# Patient Record
Sex: Female | Born: 1985
Health system: Southern US, Community
[De-identification: ages and names within clinical notes are randomized; demographics above are authoritative.]

## PROBLEM LIST (undated history)

## (undated) DIAGNOSIS — K219 Gastro-esophageal reflux disease without esophagitis: Secondary | ICD-10-CM

## (undated) DIAGNOSIS — D649 Anemia, unspecified: Secondary | ICD-10-CM

## (undated) DIAGNOSIS — R635 Abnormal weight gain: Secondary | ICD-10-CM

## (undated) DIAGNOSIS — Z8619 Personal history of other infectious and parasitic diseases: Secondary | ICD-10-CM

## (undated) DIAGNOSIS — C4491 Basal cell carcinoma of skin, unspecified: Secondary | ICD-10-CM

## (undated) DIAGNOSIS — F32A Depression, unspecified: Secondary | ICD-10-CM

## (undated) DIAGNOSIS — M2550 Pain in unspecified joint: Secondary | ICD-10-CM

## (undated) HISTORY — DX: Personal history of other infectious and parasitic diseases: Z86.19

## (undated) HISTORY — DX: Pain in unspecified joint: M25.50

## (undated) HISTORY — DX: Gastro-esophageal reflux disease without esophagitis: K21.9

## (undated) HISTORY — PX: LUMBAR DISC SURGERY: SHX700

## (undated) HISTORY — DX: Depression, unspecified: F32.A

## (undated) HISTORY — DX: Basal cell carcinoma of skin, unspecified: C44.91

## (undated) HISTORY — PX: WISDOM TOOTH EXTRACTION: SHX21

## (undated) HISTORY — DX: Anemia, unspecified: D64.9

## (undated) HISTORY — DX: Abnormal weight gain: R63.5

---

## 2009-10-22 HISTORY — PX: DILATION AND CURETTAGE OF UTERUS: SHX78

## 2012-01-02 DIAGNOSIS — D229 Melanocytic nevi, unspecified: Secondary | ICD-10-CM | POA: Insufficient documentation

## 2014-09-01 LAB — TSH: TSH: 1.74 u[IU]/mL (ref <=5.90)

## 2014-09-01 LAB — BASIC METABOLIC PANEL
Creatinine: 0.8 mg/dL (ref <=1.1)
Glucose: 93 mg/dL
Potassium: 4.4 mmol/L (ref 3.4–5.3)

## 2014-09-01 LAB — HEPATIC FUNCTION PANEL
ALT: 19 U/L (ref 7–35)
AST: 17 U/L (ref 13–35)
Alkaline Phosphatase: 61 U/L (ref 25–125)

## 2014-09-01 LAB — LIPID PANEL
Cholesterol: 164 mg/dL (ref 0–200)
HDL: 48 mg/dL (ref 35–70)
LDL Cholesterol: 112 mg/dL
Triglycerides: 84 mg/dL (ref 40–160)

## 2015-05-27 ENCOUNTER — Ambulatory Visit (INDEPENDENT_AMBULATORY_CARE_PROVIDER_SITE_OTHER): Payer: 59 | Admitting: Family Medicine

## 2015-05-27 ENCOUNTER — Encounter: Payer: Self-pay | Admitting: Family Medicine

## 2015-05-27 VITALS — BP 121/78 | HR 63 | Ht 65.0 in | Wt 174.0 lb

## 2015-05-27 DIAGNOSIS — E669 Obesity, unspecified: Secondary | ICD-10-CM | POA: Diagnosis not present

## 2015-05-27 DIAGNOSIS — M25511 Pain in right shoulder: Secondary | ICD-10-CM | POA: Diagnosis not present

## 2015-05-27 MED ORDER — PHENTERMINE HCL 37.5 MG PO TABS: 37.5000 mg | ORAL_TABLET | Freq: Every day | ORAL | Status: DC

## 2015-05-27 NOTE — Progress Notes (Signed)
CC: Kimberly Orr is a 29 y.o. 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 She once had an MRI done on it and she is not really sure what the results were. On direct questioning she thinks bursitis sounds familiar. She's never had any injections or physical therapy. It's localized on the lateral aspect of the right shoulder. Nonradiating. It's worse with placing her hand above her head or behind her back. Denies any catching swelling redness or warmth of the joint. He denies any recent or remote trauma. Denies any motor or sensory disturbances in the right upper extremity. There will be weeks or she has absolutely no pain at all where for no reasonable than just flareup for another few weeks and go away on its own. Mild improvement with ibuprofen.  Complains of difficulty with losing weight. She has trouble with portion control and sticking to an exercise routine. She's been successful with eating food gets healthier but portions seem to be a problem. She wants another medication to help her lose weight.  Review of Systems - General ROS: negative for - chills, fever, night sweats, weight gain or weight loss Ophthalmic ROS: negative for - decreased vision Psychological ROS: negative for - anxiety or depression ENT ROS: negative for - hearing change, nasal congestion, tinnitus or allergies Hematological and Lymphatic ROS: negative for - bleeding problems, bruising or swollen lymph nodes Breast ROS: negative Respiratory ROS: no cough, shortness of breath, or wheezing Cardiovascular ROS: no chest pain or dyspnea on exertion Gastrointestinal ROS: no abdominal pain, change in bowel habits, or black or bloody stools Genito-Urinary ROS: negative for - genital discharge, genital ulcers, incontinence or abnormal bleeding from genitals Musculoskeletal ROS:  negative for - joint pain or muscle pain other than that described above Neurological ROS: negative for - headaches or memory loss Dermatological ROS: negative for lumps, mole changes, rash and skin lesion changes  Past Medical History  Diagnosis Date  . History of chicken pox     Past Surgical History  Procedure Laterality Date  . Wisdom tooth extraction    . Dilation and curettage of uterus  2011   Family History  Problem Relation Age of Onset  . Skin cancer      mother and father   . Heart attack Maternal Grandfather   . Hypertension Maternal Grandfather     History   Social History  . Marital Status: Married    Spouse Name: N/A  . Number of Children: N/A  . Years of Education: N/A   Occupational History  . Not on file.   Social History Main Topics  . Smoking status: Never Smoker   . Smokeless tobacco: Not on file  . Alcohol Use: No  . Drug Use: No  . Sexual Activity:    Partners: Male   Other Topics Concern  . Not on file   Social History Narrative  . No narrative on file     Objective: BP 121/78 mmHg  Pulse 63  Ht 5\' 5"  (1.651 m)  Wt 174 lb (78.926 kg)  BMI 28.96 kg/m2  General: Alert and Oriented, No Acute Distress HEENT: Pupils equal, round, reactive to light. Conjunctivae clear.  Moist mucous membranes Lungs: Clear comfortable work of breathing Cardiac: Regular rate and rhythm.  Right shoulder exam reveals full range of motion and strength in all planes  of motion and with individual rotator cuff testing. No overlying redness warmth or swelling.  Neer's test positive.  Hawkins test negative. Empty can negative. Crossarm test negative. O'Brien's test positive. Apprehension test negative. Speed's test negative. Extremities: No peripheral edema.  Strong peripheral pulses.  Mental Status: No depression, anxiety, nor agitation. Skin: Warm and dry.  Assessment & Plan: Kimberly Orr was seen today for establish care.  Diagnoses and all orders for this  visit:  Right shoulder pain  Obesity Orders: -     phentermine (ADIPEX-P) 37.5 MG tablet; Take 1 tablet (37.5 mg total) by mouth daily before breakfast.   Right shoulder pain: Suspect bursitis or tendinitis. The fact that goes away completely for weeks at a time makes me think that a labral tear is low on the differential. Obesity: Starting phentermine, discussed that this is only a temporary fix for losing weight and that exercising more frequently would greatly improve her weight loss.  Return if symptoms worsen or fail to improve.

## 2015-06-23 ENCOUNTER — Ambulatory Visit (INDEPENDENT_AMBULATORY_CARE_PROVIDER_SITE_OTHER): Payer: 59 | Admitting: Family Medicine

## 2015-06-23 VITALS — BP 127/87 | HR 78 | Wt 170.0 lb

## 2015-06-23 DIAGNOSIS — R635 Abnormal weight gain: Secondary | ICD-10-CM | POA: Diagnosis not present

## 2015-06-23 DIAGNOSIS — E669 Obesity, unspecified: Secondary | ICD-10-CM | POA: Diagnosis not present

## 2015-06-23 MED ORDER — PHENTERMINE HCL 37.5 MG PO TABS: 37.5000 mg | ORAL_TABLET | Freq: Every day | ORAL | Status: DC

## 2015-06-23 NOTE — Progress Notes (Signed)
   Subjective:    Patient ID: Kimberly Orr, female    DOB: May 30, 1986, 29 y.o.   MRN: [id number]  HPI Patient is here for blood pressure and weight check. Denies any trouble sleeping, palpitations, or any other medication problems.    Review of Systems     Objective:   Physical Exam        Assessment & Plan:  Patient has lost weight. A refill for Phentermine will be sent to patient preferred pharmacy. Patient advised to schedule a four week nurse visit and keep her upcoming appointment with her PCP. Verbalized understanding, no further questions.  Abnormal weight gain-has lost 4 pounds which is fantastic and blood pressures well controlled. Continue current regimen and follow-up in one month.  Beatrice Lecher, MD

## 2015-08-03 ENCOUNTER — Encounter: Payer: Self-pay | Admitting: Family Medicine

## 2015-08-10 ENCOUNTER — Ambulatory Visit (INDEPENDENT_AMBULATORY_CARE_PROVIDER_SITE_OTHER): Payer: 59 | Admitting: Family Medicine

## 2015-08-10 VITALS — BP 122/81 | HR 99 | Wt 166.0 lb

## 2015-08-10 DIAGNOSIS — E669 Obesity, unspecified: Secondary | ICD-10-CM

## 2015-08-10 MED ORDER — PHENTERMINE HCL 37.5 MG PO TABS: 37.5000 mg | ORAL_TABLET | Freq: Every day | ORAL | Status: DC

## 2015-08-10 NOTE — Progress Notes (Signed)
   Subjective:    Patient ID: Kimberly Orr, female    DOB: Dec 05, 1985, 29 y.o.   MRN: [id number]  HPI  Patient is here for blood pressure and weight check. Denies any trouble sleeping, palpitations, or any other medication problems. Pt reports she is cutting back on her soda and started working out more.  Review of Systems     Objective:   Physical Exam        Assessment & Plan:  Patient has lost weight. A refill for Phentermine will be sent to patient preferred pharmacy. Patient advised to schedule a four week nurse visit and keep her upcoming appointment with her PCP. Verbalized understanding, no further questions.

## 2015-08-10 NOTE — Progress Notes (Signed)
Weight loss success, refill approved

## 2015-09-13 ENCOUNTER — Ambulatory Visit (INDEPENDENT_AMBULATORY_CARE_PROVIDER_SITE_OTHER): Payer: 59 | Admitting: Family Medicine

## 2015-09-13 ENCOUNTER — Encounter: Payer: Self-pay | Admitting: Family Medicine

## 2015-09-13 VITALS — BP 124/85 | HR 70 | Wt 162.0 lb

## 2015-09-13 DIAGNOSIS — R635 Abnormal weight gain: Secondary | ICD-10-CM | POA: Diagnosis not present

## 2015-09-13 DIAGNOSIS — E669 Obesity, unspecified: Secondary | ICD-10-CM

## 2015-09-13 MED ORDER — PHENTERMINE HCL 37.5 MG PO TABS
37.5000 mg | ORAL_TABLET | Freq: Every day | ORAL | Status: DC
Start: 2015-09-13 — End: 2015-12-13

## 2015-09-13 NOTE — Progress Notes (Signed)
CC: Kimberly Orr is a 29 y.o. female is here for abnormal weight gain   Subjective: HPI:  Follow-up obesity: Taking phentermine on a daily basis without known side effects other than possible constipation. She denies any sleep disturbance or anxiety. She believes is helping with weight loss and appetite suppression. Trying to stay active as well. She's noticed a difference in weight loss with respect to smaller size clothes.   Review Of Systems Outlined In HPI  Past Medical History  Diagnosis Date  . History of chicken pox     Past Surgical History  Procedure Laterality Date  . Wisdom tooth extraction    . Dilation and curettage of uterus  2011   Family History  Problem Relation Age of Onset  . Skin cancer      mother and father   . Heart attack Maternal Grandfather   . Hypertension Maternal Grandfather     Social History   Social History  . Marital Status: Married    Spouse Name: N/A  . Number of Children: N/A  . Years of Education: N/A   Occupational History  . Not on file.   Social History Main Topics  . Smoking status: Never Smoker   . Smokeless tobacco: Not on file  . Alcohol Use: No  . Drug Use: No  . Sexual Activity:    Partners: Male   Other Topics Concern  . Not on file   Social History Narrative     Objective: BP 124/85 mmHg  Pulse 70  Wt 162 lb (73.483 kg)  Vital signs reviewed. General: Alert and Oriented, No Acute Distress HEENT: Pupils equal, round, reactive to light. Conjunctivae clear.  External ears unremarkable.  Moist mucous membranes. Lungs: Clear and comfortable work of breathing, speaking in full sentences without accessory muscle use. Cardiac: Regular rate and rhythm.  Neuro: CN II-XII grossly intact, gait normal. Extremities: No peripheral edema.  Strong peripheral pulses.  Mental Status: No depression, anxiety, nor agitation. Logical though process. Skin: Warm and dry. Assessment & Plan: [name] was seen today for  abnormal weight gain.  Diagnoses and all orders for this visit:  Abnormal weight gain  Obesity -     phentermine (ADIPEX-P) 37.5 MG tablet; Take 1 tablet (37.5 mg total) by mouth daily before breakfast.   Continue weight loss success, continuing phentermine until it loses its effectiveness. Discussed increasing water and starting a stool softener to help with constipation.  Return in about 4 weeks (around 10/11/2015) for Nurse Visit Weight Check.

## 2015-10-10 ENCOUNTER — Telehealth: Payer: Self-pay | Admitting: Family Medicine

## 2015-10-10 NOTE — Telephone Encounter (Signed)
Pt received her flu shot at [facility] in Oct.

## 2015-10-11 ENCOUNTER — Ambulatory Visit: Payer: 59

## 2015-11-14 MED FILL — LARIN 21 1-20 TABLET: 1-20 | 84 days supply | Qty: 84 | Fill #2

## 2015-12-13 ENCOUNTER — Ambulatory Visit (INDEPENDENT_AMBULATORY_CARE_PROVIDER_SITE_OTHER): Payer: 59 | Admitting: Family Medicine

## 2015-12-13 ENCOUNTER — Encounter: Payer: Self-pay | Admitting: Family Medicine

## 2015-12-13 VITALS — BP 121/79 | HR 75 | Wt 164.0 lb

## 2015-12-13 DIAGNOSIS — E669 Obesity, unspecified: Secondary | ICD-10-CM

## 2015-12-13 DIAGNOSIS — L74519 Primary focal hyperhidrosis, unspecified: Secondary | ICD-10-CM | POA: Diagnosis not present

## 2015-12-13 DIAGNOSIS — R61 Generalized hyperhidrosis: Secondary | ICD-10-CM

## 2015-12-13 DIAGNOSIS — Z0000 Encounter for general adult medical examination without abnormal findings: Secondary | ICD-10-CM

## 2015-12-13 MED ORDER — ALUMINUM CHLORIDE 20 % EX SOLN: CUTANEOUS | Status: DC

## 2015-12-13 MED ORDER — PHENTERMINE HCL 37.5 MG PO TABS: 37.5000 mg | ORAL_TABLET | Freq: Every day | ORAL | Status: DC

## 2015-12-13 MED FILL — PHENTERMINE 37.5 MG TABLET: 37.5 | 30 days supply | Qty: 30 | Fill #0

## 2015-12-13 MED FILL — DRYSOL SOLUTION: 20 | 90 days supply | Qty: 113 | Fill #0

## 2015-12-13 NOTE — Progress Notes (Signed)
CC: Kimberly Orr is a 30 y.o. female is here for Annual Exam and Medication Management   Subjective: HPI:  Colonoscopy: No current indication Papsmear: Normal last year Mammogram: No current indication for screening  Influenza Vaccine: UTD Pneumovax: No current indication Td/Tdap: Up-to-date from 2013 Zoster: (Start 32 yo)  Requesting complete physical exam and refill on phentermine. She was able to get through the holidays without this medication without gaining weight. She is having difficulty with portion control now. She also complains of increased sweating of the armpits. Symptoms are interfering with quality of life and her varices. No benefit from multiple attempts with over-the-counter antiperspirants.  Review of Systems - General ROS: negative for - chills, fever, night sweats, weight gain or weight loss Ophthalmic ROS: negative for - decreased vision Psychological ROS: negative for - anxiety or depression ENT ROS: negative for - hearing change, nasal congestion, tinnitus or allergies Hematological and Lymphatic ROS: negative for - bleeding problems, bruising or swollen lymph nodes Breast ROS: negative Respiratory ROS: no cough, shortness of breath, or wheezing Cardiovascular ROS: no chest pain or dyspnea on exertion Gastrointestinal ROS: no abdominal pain, change in bowel habits, or black or bloody stools Genito-Urinary ROS: negative for - genital discharge, genital ulcers, incontinence or abnormal bleeding from genitals Musculoskeletal ROS: negative for - joint pain or muscle pain Neurological ROS: negative for - headaches or memory loss Dermatological ROS: negative for lumps, mole changes, rash and skin lesion changes  Past Medical History  Diagnosis Date  . History of chicken pox     Past Surgical History  Procedure Laterality Date  . Wisdom tooth extraction    . Dilation and curettage of uterus  2011   Family History  Problem Relation Age of Onset  . Skin  cancer      mother and father   . Heart attack Maternal Grandfather   . Hypertension Maternal Grandfather     Social History   Social History  . Marital Status: Married    Spouse Name: N/A  . Number of Children: N/A  . Years of Education: N/A   Occupational History  . Not on file.   Social History Main Topics  . Smoking status: Never Smoker   . Smokeless tobacco: Not on file  . Alcohol Use: No  . Drug Use: No  . Sexual Activity:    Partners: Male   Other Topics Concern  . Not on file   Social History Narrative     Objective: BP 121/79 mmHg  Pulse 75  Wt 164 lb (74.39 kg)  General: No Acute Distress HEENT: Atraumatic, normocephalic, conjunctivae normal without scleral icterus.  No nasal discharge, hearing grossly intact, TMs with good landmarks bilaterally with no middle ear abnormalities, posterior pharynx clear without oral lesions. Neck: Supple, trachea midline, no cervical nor supraclavicular adenopathy. Pulmonary: Clear to auscultation bilaterally without wheezing, rhonchi, nor rales. Cardiac: Regular rate and rhythm.  No murmurs, rubs, nor gallops. No peripheral edema.  2+ peripheral pulses bilaterally. Abdomen: Bowel sounds normal.  No masses.  Non-tender without rebound.  Negative Murphy's sign. MSK: Grossly intact, no signs of weakness.  Full strength throughout upper and lower extremities.  Full ROM in upper and lower extremities.  No midline spinal tenderness. Neuro: Gait unremarkable, CN II-XII grossly intact.  C5-C6 Reflex 2/4 Bilaterally, L4 Reflex 2/4 Bilaterally.  Cerebellar function intact. Skin: No rashes. Psych: Alert and oriented to person/place/time.  Thought process normal. No anxiety/depression.   Assessment & Plan: Kimberly Orr was seen today  for annual exam and medication management.  Diagnoses and all orders for this visit:  Annual physical exam -     Lipid panel -     COMPLETE METABOLIC PANEL WITH GFR -     CBC -     TSH  Obesity -      phentermine (ADIPEX-P) 37.5 MG tablet; Take 1 tablet (37.5 mg total) by mouth daily before breakfast. -     TSH  Hyperhydrosis disorder -     aluminum chloride (DRYSOL) 20 % external solution; Apply to underarms three night straight then only once a week as maintenance. If irritation occurs use hydrocortisone cream after product dries.   Healthy lifestyle interventions including but not limited to regular exercise, a healthy low fat diet, moderation of salt intake, the dangers of tobacco/alcohol/recreational drug use, nutrition supplementation, and accident avoidance were discussed with the patient and a handout was provided for future reference.  staet Drysol for hyperhidrosis of the armpits.  No Follow-up on file.

## 2015-12-14 LAB — LIPID PANEL
Cholesterol: 152 mg/dL (ref 125–200)
HDL: 50 mg/dL (ref >=46)
LDL Cholesterol: 88 mg/dL (ref <130)
Total CHOL/HDL Ratio: 3 Ratio (ref <=5.0)
Triglycerides: 69 mg/dL (ref <150)
VLDL: 14 mg/dL (ref <30)

## 2015-12-14 LAB — CBC
HCT: 43.9 % (ref 36.0–46.0)
Hemoglobin: 14.3 g/dL (ref 12.0–15.0)
MCH: 27.1 pg (ref 26.0–34.0)
MCHC: 32.6 g/dL (ref 30.0–36.0)
MCV: 83.3 fL (ref 78.0–100.0)
MPV: 9.9 fL (ref 8.6–12.4)
Platelets: 319 10*3/uL (ref 150–400)
RBC: 5.27 MIL/uL — ABNORMAL HIGH (ref 3.87–5.11)
RDW: 13 % (ref 11.5–15.5)
WBC: 7.3 10*3/uL (ref 4.0–10.5)

## 2015-12-14 LAB — COMPLETE METABOLIC PANEL WITH GFR
ALT: 17 U/L (ref 6–29)
AST: 16 U/L (ref 10–30)
Albumin: 4.2 g/dL (ref 3.6–5.1)
Alkaline Phosphatase: 58 U/L (ref 33–115)
BUN: 11 mg/dL (ref 7–25)
CO2: 25 mmol/L (ref 20–31)
Calcium: 9.2 mg/dL (ref 8.6–10.2)
Chloride: 103 mmol/L (ref 98–110)
Creat: 0.95 mg/dL (ref 0.50–1.10)
GFR, Est African American: >89 mL/min (ref >=60)
GFR, Est Non African American: 80 mL/min (ref >=60)
Glucose, Bld: 93 mg/dL (ref 65–99)
Potassium: 4.4 mmol/L (ref 3.5–5.3)
Sodium: 138 mmol/L (ref 135–146)
Total Bilirubin: 0.8 mg/dL (ref 0.2–1.2)
Total Protein: 6.5 g/dL (ref 6.1–8.1)

## 2015-12-14 LAB — TSH: TSH: 1.75 mIU/L

## 2015-12-15 ENCOUNTER — Telehealth: Payer: Self-pay | Admitting: Family Medicine

## 2016-01-10 ENCOUNTER — Ambulatory Visit: Payer: 59

## 2016-01-12 ENCOUNTER — Ambulatory Visit: Payer: 59

## 2016-02-02 ENCOUNTER — Encounter (HOSPITAL_BASED_OUTPATIENT_CLINIC_OR_DEPARTMENT_OTHER): Payer: Self-pay

## 2016-02-02 ENCOUNTER — Emergency Department (HOSPITAL_BASED_OUTPATIENT_CLINIC_OR_DEPARTMENT_OTHER): Payer: 59

## 2016-02-02 ENCOUNTER — Emergency Department (HOSPITAL_BASED_OUTPATIENT_CLINIC_OR_DEPARTMENT_OTHER)
Admission: EM | Admit: 2016-02-02 | Discharge: 2016-02-03 | Disposition: A | Discharge status: Home / Self Care | Payer: 59 | Attending: Emergency Medicine | Admitting: Emergency Medicine

## 2016-02-02 DIAGNOSIS — R1011 Right upper quadrant pain: Secondary | ICD-10-CM | POA: Diagnosis not present

## 2016-02-02 DIAGNOSIS — R110 Nausea: Secondary | ICD-10-CM | POA: Insufficient documentation

## 2016-02-02 DIAGNOSIS — R1013 Epigastric pain: Secondary | ICD-10-CM | POA: Diagnosis present

## 2016-02-02 LAB — CBC
HCT: 44.2 % (ref 36.0–46.0)
Hemoglobin: 15.1 g/dL — ABNORMAL HIGH (ref 12.0–15.0)
MCH: 28.1 pg (ref 26.0–34.0)
MCHC: 34.2 g/dL (ref 30.0–36.0)
MCV: 82.2 fL (ref 78.0–100.0)
Platelets: 351 10*3/uL (ref 150–400)
RBC: 5.38 MIL/uL — ABNORMAL HIGH (ref 3.87–5.11)
RDW: 12.4 % (ref 11.5–15.5)
WBC: 10.8 10*3/uL — ABNORMAL HIGH (ref 4.0–10.5)

## 2016-02-02 LAB — URINALYSIS, ROUTINE W REFLEX MICROSCOPIC
Bilirubin Urine: NEGATIVE
Glucose, UA: NEGATIVE mg/dL
Hgb urine dipstick: NEGATIVE
Ketones, ur: NEGATIVE mg/dL
Nitrite: NEGATIVE
Protein, ur: NEGATIVE mg/dL
Specific Gravity, Urine: 1.01 (ref 1.005–1.030)
pH: 7.5 (ref 5.0–8.0)

## 2016-02-02 LAB — COMPREHENSIVE METABOLIC PANEL
ALT: 18 U/L (ref 14–54)
AST: 17 U/L (ref 15–41)
Albumin: 4.3 g/dL (ref 3.5–5.0)
Alkaline Phosphatase: 65 U/L (ref 38–126)
Anion gap: 8 (ref 5–15)
BUN: 10 mg/dL (ref 6–20)
CO2: 26 mmol/L (ref 22–32)
Calcium: 9.2 mg/dL (ref 8.9–10.3)
Chloride: 104 mmol/L (ref 101–111)
Creatinine, Ser: 0.82 mg/dL (ref 0.44–1.00)
GFR calc Af Amer: >60 mL/min (ref >60)
GFR calc non Af Amer: >60 mL/min (ref >60)
Glucose, Bld: 111 mg/dL — ABNORMAL HIGH (ref 65–99)
Potassium: 3.5 mmol/L (ref 3.5–5.1)
Sodium: 138 mmol/L (ref 135–145)
Total Bilirubin: 0.4 mg/dL (ref 0.3–1.2)
Total Protein: 7.5 g/dL (ref 6.5–8.1)

## 2016-02-02 LAB — URINE MICROSCOPIC-ADD ON

## 2016-02-02 LAB — PREGNANCY, URINE: Preg Test, Ur: NEGATIVE

## 2016-02-02 MED ORDER — PANTOPRAZOLE SODIUM 40 MG IV SOLR
40.0000 mg | Freq: Once | INTRAVENOUS | Status: AC
  Administered 2016-02-02: 40 mg via INTRAVENOUS
  Filled 2016-02-02: qty 40

## 2016-02-02 MED ORDER — ONDANSETRON HCL 4 MG/2ML IJ SOLN
INTRAMUSCULAR | Status: AC
  Filled 2016-02-02: qty 2

## 2016-02-02 MED ORDER — GI COCKTAIL ~~LOC~~
30.0000 mL | Freq: Once | ORAL | Status: AC
  Administered 2016-02-02: 30 mL via ORAL
  Filled 2016-02-02: qty 30

## 2016-02-02 MED ORDER — ONDANSETRON HCL 4 MG/2ML IJ SOLN
4.0000 mg | Freq: Once | INTRAMUSCULAR | Status: AC
  Administered 2016-02-02: 4 mg via INTRAVENOUS

## 2016-02-02 NOTE — ED Notes (Signed)
Patient transported to Ultrasound 

## 2016-02-02 NOTE — ED Notes (Addendum)
C/o intermittent abd pain since yesterday-denies n/v/d-urinary s/s or bowel changes-pt tearful,wincing and pulling knees to chest for comfort

## 2016-02-03 MED ORDER — SUCRALFATE 1 G PO TABS: 1.0000 g | ORAL_TABLET | Freq: Three times a day (TID) | ORAL | Status: DC

## 2016-02-03 MED ORDER — OMEPRAZOLE 20 MG PO CPDR: 20.0000 mg | DELAYED_RELEASE_CAPSULE | Freq: Every day | ORAL | Status: DC

## 2016-02-03 MED FILL — OMEPRAZOLE DR 20 MG CAPSULE: 20 | 30 days supply | Qty: 30 | Fill #0

## 2016-02-03 MED FILL — SUCRALFATE 1 GM TABLET: 1 | 8 days supply | Qty: 30 | Fill #0

## 2016-02-03 NOTE — ED Provider Notes (Signed)
CSN: [id number]     Arrival date & time 02/02/16  2121 History   First MD Initiated Contact with Patient 02/02/16 2231     Chief Complaint  Patient presents with  . Abdominal Pain     (Consider location/radiation/quality/duration/timing/severity/associated sxs/prior Treatment) HPI Comments: Patient presents today with a chief complaint of epigastric abdominal pain.  Pain has been intermittent since last evening.  Pain does worsen after eating.  She has not taken anything for the pain prior to arrival.  She reports that pain is improving somewhat at this time.  She denies having similar pain in the past.  She reports associated nausea, but denies vomiting.  Denies fever, chills, cough, SOB, chest pain, or any other symptoms at this time.  Denies history of Gallstones or PUD.    Patient is a 30 y.o. female presenting with abdominal pain. The history is provided by the patient.  Abdominal Pain   Past Medical History  Diagnosis Date  . History of chicken pox    Past Surgical History  Procedure Laterality Date  . Wisdom tooth extraction    . Dilation and curettage of uterus  2011   Family History  Problem Relation Age of Onset  . Skin cancer      mother and father   . Heart attack Maternal Grandfather   . Hypertension Maternal Grandfather    Social History  Substance Use Topics  . Smoking status: Never Smoker   . Smokeless tobacco: None  . Alcohol Use: No   OB History    No data available     Review of Systems  Gastrointestinal: Positive for abdominal pain.  All other systems reviewed and are negative.     Allergies  Review of patient's allergies indicates no known allergies.  Home Medications   Prior to Admission medications   Medication Sig Start Date End Date Taking? Authorizing Provider  aluminum chloride (DRYSOL) 20 % external solution Apply to underarms three night straight then only once a week as maintenance. If irritation occurs use hydrocortisone cream  after product dries. 12/13/15   Sean Hommel, DO  ibuprofen (ADVIL,MOTRIN) 800 MG tablet Take 800 mg by mouth every 8 (eight) hours as needed.    Historical Provider, MD  phentermine (ADIPEX-P) 37.5 MG tablet Take 1 tablet (37.5 mg total) by mouth daily before breakfast. 12/13/15   Sean Hommel, DO   BP 113/80 mmHg  Pulse 67  Temp(Src) 97.7 F (36.5 C) (Oral)  Resp 18  Ht 5\' 5"  (1.651 m)  Wt 75.751 kg  BMI 27.79 kg/m2  SpO2 96%  LMP 01/01/2016 Physical Exam  Constitutional: She appears well-developed and well-nourished.  HENT:  Head: Normocephalic and atraumatic.  Mouth/Throat: Oropharynx is clear and moist.  Neck: Normal range of motion. Neck supple.  Cardiovascular: Normal rate, regular rhythm and normal heart sounds.   Pulmonary/Chest: Effort normal and breath sounds normal.  Abdominal: Soft. Bowel sounds are normal. She exhibits no distension and no mass. There is tenderness in the epigastric area. There is no rebound, no guarding and no tenderness at McBurney's point.  Mild tenderness to palpation of the RUQ  Musculoskeletal: Normal range of motion.  Neurological: She is alert.  Skin: Skin is warm and dry.  Psychiatric: She has a normal mood and affect.  Nursing note and vitals reviewed.   ED Course  Procedures (including critical care time) Labs Review Labs Reviewed  URINALYSIS, ROUTINE W REFLEX MICROSCOPIC (NOT AT [facility]) - Abnormal; Notable for the following:  APPearance CLOUDY (*)    Leukocytes, UA SMALL (*)    All other components within normal limits  CBC - Abnormal; Notable for the following:    WBC 10.8 (*)    RBC 5.38 (*)    Hemoglobin 15.1 (*)    All other components within normal limits  COMPREHENSIVE METABOLIC PANEL - Abnormal; Notable for the following:    Glucose, Bld 111 (*)    All other components within normal limits  URINE MICROSCOPIC-ADD ON - Abnormal; Notable for the following:    Squamous Epithelial / LPF 0-5 (*)    Bacteria, UA FEW (*)    All  other components within normal limits  PREGNANCY, URINE    Imaging Review US Abdomen Limited Ruq  02/03/2016  CLINICAL DATA:  Right upper quadrant pain for 2 days. EXAM: US ABDOMEN LIMITED - RIGHT UPPER QUADRANT COMPARISON:  None. FINDINGS: Gallbladder: No gallstones or wall thickening visualized. No sonographic Murphy sign noted by sonographer. Common bile duct: Diameter: 3.2 mm Liver: No focal lesion identified. Within normal limits in parenchymal echogenicity. IMPRESSION: The gallbladder was not well distended for a fasting exam, but otherwise unremarkable. Normal liver and extrahepatic bile ducts. Electronically Signed   By: Andreas Newport M.D.   On: 02/03/2016 00:27   I have personally reviewed and evaluated these images and lab results as part of my medical decision-making.   EKG Interpretation None     12:30 AM Reassessed patient.  She reports improvement in pain at this time.  Abdomen is soft with mild tenderness to palpation of the epigastrium. MDM   Final diagnoses:  RUQ abdominal pain   Patient presents today with epigastric abdominal pain that has been intermittent since last evening.  On exam, she has tenderness to palpation of the epigastrium and also mild tenderness of the RUQ.  She denies CP or SOB.  Labs unremarkable.  Urine pregnancy negative.  Ultrasound RUQ is negative.  Pain improved with Protonix and GI cocktail.  Feel that the patient is stable for discharge.  Patient given referral to GI to follow up with if pain continues to reoccur.  Patient stable for discharge.  Return precautions given.    Hyman Bible, PA-C 02/03/16 [place], DO 02/06/16 [phone]

## 2016-02-14 MED FILL — NORETHIND-ETH ESTRAD 1-0.02: 1-20 | 84 days supply | Qty: 84 | Fill #3

## 2016-05-15 ENCOUNTER — Telehealth: Payer: Self-pay | Admitting: *Deleted

## 2016-05-15 DIAGNOSIS — B379 Candidiasis, unspecified: Secondary | ICD-10-CM

## 2016-05-15 MED ORDER — FLUCONAZOLE 150 MG PO TABS: ORAL_TABLET | ORAL | 0 refills | Status: DC

## 2016-05-15 MED FILL — FLUCONAZOLE 150 MG TABLET: 150 | 3 days supply | Qty: 2 | Fill #0

## 2016-05-15 NOTE — Telephone Encounter (Signed)
Pt called stating that she has a yeast infection and requesting a RX for Diflucan .  Spoke with Dr Hulan Fray who [place].  Med sent to [facility]

## 2016-05-29 ENCOUNTER — Telehealth: Payer: Self-pay | Admitting: *Deleted

## 2016-05-29 MED FILL — TRI-LO-SPRINTEC TABLET: 0.18/0.215/ | 84 days supply | Qty: 84 | Fill #0

## 2016-05-29 NOTE — Telephone Encounter (Signed)
Erroneous encounter

## 2016-07-23 ENCOUNTER — Ambulatory Visit (INDEPENDENT_AMBULATORY_CARE_PROVIDER_SITE_OTHER): Payer: 59 | Admitting: Obstetrics & Gynecology

## 2016-07-23 ENCOUNTER — Encounter: Payer: Self-pay | Admitting: Obstetrics & Gynecology

## 2016-07-23 VITALS — BP 132/79 | HR 83 | Ht 65.0 in | Wt 176.0 lb

## 2016-07-23 DIAGNOSIS — B379 Candidiasis, unspecified: Secondary | ICD-10-CM

## 2016-07-23 DIAGNOSIS — E6609 Other obesity due to excess calories: Secondary | ICD-10-CM

## 2016-07-23 DIAGNOSIS — Z01419 Encounter for gynecological examination (general) (routine) without abnormal findings: Secondary | ICD-10-CM

## 2016-07-23 DIAGNOSIS — R011 Cardiac murmur, unspecified: Secondary | ICD-10-CM

## 2016-07-23 DIAGNOSIS — Z124 Encounter for screening for malignant neoplasm of cervix: Secondary | ICD-10-CM

## 2016-07-23 DIAGNOSIS — Z1151 Encounter for screening for human papillomavirus (HPV): Secondary | ICD-10-CM

## 2016-07-23 MED ORDER — FLUCONAZOLE 150 MG PO TABS: ORAL_TABLET | ORAL | 2 refills | Status: DC

## 2016-07-23 MED ORDER — PHENTERMINE HCL 37.5 MG PO TABS: 37.5000 mg | ORAL_TABLET | Freq: Every day | ORAL | 0 refills | Status: DC

## 2016-07-23 MED FILL — FLUCONAZOLE 150 MG TABLET: 150 | 6 days supply | Qty: 2 | Fill #0

## 2016-07-23 NOTE — Progress Notes (Signed)
Subjective:    Kimberly Orr is a 30 y.o. 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 The patient has no complaints today. The patient is sexually active. GYN screening history: last pap: was normal. The patient wears seatbelts: yes. The patient participates in regular exercise: yes. Has the patient ever been transfused or tattooed?: no. The patient reports that there is not domestic violence in her life.   Menstrual History: OB History    Gravida Para Term Preterm AB Living   2 2 2     2    SAB TAB Ectopic Multiple Live Births                  Menarche age: 55 Patient's last menstrual period was 06/23/2016 (exact date).    The following portions of the patient's history were reviewed and updated as appropriate: allergies, current medications, past family history, past medical history, past social history, past surgical history and problem list.  Review of Systems Pertinent items are noted in HPI. RN at [employer], Married for 11 years, denies dyspareunia. Uses OCPs. Gets flu vaccine. FH + for breast cancer in pGM, no gyn or colon cancer.   Objective:    BP 132/79   Pulse 83   Ht 5\' 5"  (1.651 m)   Wt 176 lb (79.8 kg)   LMP 06/23/2016 (Exact Date)   BMI 29.29 kg/m   General Appearance:    Alert, cooperative, no distress, appears stated age  Head:    Normocephalic, without obvious abnormality, atraumatic  Eyes:    PERRL, conjunctiva/corneas clear, EOM's intact, fundi    benign, both eyes  Ears:    Normal TM's and external ear canals, both ears  Nose:   Nares normal, septum midline, mucosa normal, no drainage    or sinus tenderness  Throat:   Lips, mucosa, and tongue normal; teeth and gums normal  Neck:   Supple, symmetrical, trachea midline, no adenopathy;    thyroid:  no enlargement/tenderness/nodules; no carotid   bruit or JVD  Back:     Symmetric, no curvature, ROM normal, no CVA tenderness  Lungs:     Clear to auscultation bilaterally, respirations  unlabored  Chest Wall:    No tenderness or deformity   Heart:    Midsystolic 3/6 murmur  Breast Exam:    No tenderness, masses, or nipple abnormality  Abdomen:     Soft, non-tender, bowel sounds active all four quadrants,    no masses, no organomegaly  Genitalia:    Normal female without lesion, discharge or tenderness, no uterine prolapse, NSSmidplane, NT, no adnexal masses     Extremities:   Extremities normal, atraumatic, no cyanosis or edema  Pulses:   2+ and symmetric all extremities  Skin:   Skin color, texture, turgor normal, no rashes or lesions  Lymph nodes:   Cervical, supraclavicular, and axillary nodes normal  Neurologic:   CNII-XII intact, normal strength, sensation and reflexes    throughout   .    Assessment:    Healthy female exam. \ Newly discovered murmur   Plan:     Breast self exam technique reviewed and patient encouraged to perform self-exam monthly. Thin prep Pap smear. with cotesting  Refill Adipex for a month See FP re echocardiogram Weight check in a month Vit D

## 2016-07-24 MED FILL — PHENTERMINE 37.5 MG TABLET: 37.5 | 30 days supply | Qty: 30 | Fill #0

## 2016-07-25 LAB — CYTOLOGY - PAP

## 2016-08-20 ENCOUNTER — Other Ambulatory Visit: Payer: Self-pay | Admitting: Obstetrics & Gynecology

## 2016-08-21 ENCOUNTER — Other Ambulatory Visit: Payer: Self-pay | Admitting: *Deleted

## 2016-08-21 DIAGNOSIS — Z3041 Encounter for surveillance of contraceptive pills: Secondary | ICD-10-CM

## 2016-08-21 MED ORDER — NORETHINDRONE ACET-ETHINYL EST 1-20 MG-MCG PO TABS: 1.0000 | ORAL_TABLET | Freq: Every day | ORAL | 12 refills | Status: DC

## 2016-08-21 MED FILL — NORETHIND-ETH ESTRAD 1-0.02: 1-20 | 84 days supply | Qty: 84 | Fill #0

## 2016-08-21 NOTE — Telephone Encounter (Signed)
RF authorization for OCP's sent to [facility]

## 2016-08-22 DIAGNOSIS — Z01419 Encounter for gynecological examination (general) (routine) without abnormal findings: Secondary | ICD-10-CM | POA: Diagnosis not present

## 2016-08-23 LAB — VITAMIN D 25 HYDROXY (VIT D DEFICIENCY, FRACTURES): Vit D, 25-Hydroxy: 37 ng/mL (ref 30–100)

## 2016-08-24 ENCOUNTER — Ambulatory Visit: Payer: Self-pay | Admitting: Family Medicine

## 2016-09-07 ENCOUNTER — Ambulatory Visit: Payer: 59 | Admitting: Physician Assistant

## 2016-10-08 ENCOUNTER — Other Ambulatory Visit: Payer: Self-pay | Admitting: *Deleted

## 2016-10-08 DIAGNOSIS — E6609 Other obesity due to excess calories: Secondary | ICD-10-CM

## 2016-10-08 MED ORDER — PHENTERMINE HCL 37.5 MG PO TABS: 37.5000 mg | ORAL_TABLET | Freq: Every day | ORAL | 0 refills | Status: DC

## 2016-10-11 MED FILL — PHENTERMINE 37.5 MG TABLET: 37.5 | 30 days supply | Qty: 30 | Fill #0

## 2016-11-09 MED FILL — NORETHIND-ETH ESTRAD 1-0.02: 1-20 | 84 days supply | Qty: 84 | Fill #1

## 2016-11-13 DIAGNOSIS — B079 Viral wart, unspecified: Secondary | ICD-10-CM | POA: Diagnosis not present

## 2016-11-13 DIAGNOSIS — D225 Melanocytic nevi of trunk: Secondary | ICD-10-CM | POA: Diagnosis not present

## 2017-02-25 MED FILL — NORETHIND-ETH ESTRAD 1-0.02: 1-20 | 84 days supply | Qty: 84 | Fill #2

## 2017-04-22 DIAGNOSIS — H5203 Hypermetropia, bilateral: Secondary | ICD-10-CM | POA: Diagnosis not present

## 2017-07-02 MED FILL — NORETHIND-ETH ESTRAD 1-0.02: 1-20 | 21 days supply | Qty: 21 | Fill #3

## 2017-07-12 ENCOUNTER — Telehealth: Payer: Self-pay | Admitting: Behavioral Health

## 2017-07-12 ENCOUNTER — Encounter: Payer: Self-pay | Admitting: Behavioral Health

## 2017-07-12 NOTE — Telephone Encounter (Signed)
Pre-Visit Call completed with patient and chart updated.   Pre-Visit Info documented in Specialty Comments under [employer].    

## 2017-07-15 ENCOUNTER — Encounter: Payer: Self-pay | Admitting: Family Medicine

## 2017-07-15 ENCOUNTER — Ambulatory Visit (INDEPENDENT_AMBULATORY_CARE_PROVIDER_SITE_OTHER): Payer: 59 | Admitting: Family Medicine

## 2017-07-15 VITALS — BP 112/78 | HR 89 | Temp 97.6°F | Ht 66.0 in | Wt 181.1 lb

## 2017-07-15 DIAGNOSIS — K219 Gastro-esophageal reflux disease without esophagitis: Secondary | ICD-10-CM | POA: Diagnosis not present

## 2017-07-15 DIAGNOSIS — S46811A Strain of other muscles, fascia and tendons at shoulder and upper arm level, right arm, initial encounter: Secondary | ICD-10-CM | POA: Diagnosis not present

## 2017-07-15 DIAGNOSIS — R1011 Right upper quadrant pain: Secondary | ICD-10-CM

## 2017-07-15 MED ORDER — OMEPRAZOLE 20 MG PO CPDR: 20.0000 mg | DELAYED_RELEASE_CAPSULE | Freq: Two times a day (BID) | ORAL | 1 refills | Status: DC

## 2017-07-15 MED FILL — OMEPRAZOLE 20 MG CAP: 20 | 30 days supply | Qty: 60 | Fill #0

## 2017-07-15 NOTE — Progress Notes (Signed)
Chief Complaint  Patient presents with  . Establish Care       New Patient Visit SUBJECTIVE: HPI: Kimberly Orr is an 31 y.o.female who is being seen for establishing care.  The patient was previously seen at Dr. Lajoyce Lauber office before he left.   RUQ pain- Constant, dull, sometimes sharp/stabbing, sometimes cramping. +nighttime awakenings. Nothing seems to make it better or worse. It is worse in the evenings. No association with meals or bowel movements. She has associated right shoulder pain, however this is been going on much longer than her current issue. No nausea, vomiting, or bleeding.  Reflux- She started having burning in her upper epigastric region and lower chest that started around 1 month ago. She thought it could be due to her reflux. She has not been diligent in finding out which factors are triggers. She started using 20 mg of omeprazole nightly which has been helpful. She has been using this consistently for the past 2 weeks.  She also has a long-standing history of right shoulder pain. She has had imaging in the past and done basic exercises which have not been helpful.  No Known Allergies  Past Medical History:  Diagnosis Date  . History of chicken pox    Past Surgical History:  Procedure Laterality Date  . DILATION AND CURETTAGE OF UTERUS  2011  . WISDOM TOOTH EXTRACTION     Social History   Social History  . Marital status: Married   Social History Main Topics  . Smoking status: Never Smoker  . Smokeless tobacco: Never Used  . Alcohol use No  . Drug use: No  . Sexual activity: Yes    Partners: Male    Birth control/ protection: Pill   Family History  Problem Relation Age of Onset  . Skin cancer Unknown        mother and father   . Heart attack Maternal Grandfather   . Hypertension Maternal Grandfather   . Heart disease Maternal Grandfather   . Cancer Mother        pre-cancerous skin  . Cancer Father        precancerous skin  . Parkinson's  disease Maternal Grandmother   . Cancer Paternal Grandmother        breast cancer  . Cancer Paternal Grandfather        prostate cancer     Current Outpatient Prescriptions:  .  ibuprofen (ADVIL,MOTRIN) 800 MG tablet, Take 800 mg by mouth every 8 (eight) hours as needed., Disp: , Rfl:  .  norethindrone-ethinyl estradiol (MICROGESTIN,JUNEL,LOESTRIN) 1-20 MG-MCG tablet, Take 1 tablet by mouth daily., Disp: 1 Package, Rfl: 12 .  omeprazole (PRILOSEC) 20 MG capsule, Take 1 capsule (20 mg total) by mouth 2 (two) times daily before a meal., Disp: 60 capsule, Rfl: 1  ROS Const: Denies fevers or unexplained wt loss  GI: As noted in HPI   OBJECTIVE: BP 112/78 (BP Location: Left Arm, Patient Position: Sitting, Cuff Size: Normal)   Pulse 89   Temp 97.6 F (36.4 C) (Oral)   Ht 5\' 6"  (1.676 m)   Wt 181 lb 2 oz (82.2 kg)   SpO2 95%   BMI 29.23 kg/m   Constitutional: -  VS reviewed -  Well developed, well nourished, appears stated age -  No apparent distress  Psychiatric: -  Oriented to person, place, and time -  Memory intact -  Affect and mood normal -  Fluent conversation, good eye contact -  Judgment and insight age  appropriate  Eye: -  Conjunctivae clear, no discharge -  Pupils symmetric, round, reactive to light  ENMT: -  MMM    Pharynx moist, no exudate, no erythema  Neck: -  No gross swelling, no palpable masses -  Thyroid midline, not enlarged, mobile, no palpable masses  Cardiovascular: -  RRR -  No LE edema  Respiratory: -  Normal respiratory effort, no accessory muscle use, no retraction -  Breath sounds equal, no wheezes, no ronchi, no crackles  Gastrointestinal: -  Bowel sounds normal -  TTP in epigastric and RUQ region, no distention, no guarding, no masses -   +Murphy's; neg Rovsing's, Carnett's, McBurney's  Musculoskeletal: -  +TTP over R trap  Skin: -  No significant lesion on inspection -  Warm and dry to palpation   ASSESSMENT/PLAN: Gastroesophageal reflux  disease, esophagitis presence not specified  Right upper quadrant pain - Plan: CBC, Comprehensive metabolic panel, US Abdomen Limited RUQ  Strain of right trapezius muscle, initial encounter  Patient instructed to sign release of records form from her previous PCP. Will order omeprazole BID. Alternatively, she can double up at night. Discussed keeping a symptom diary. Elevating head of bed and weight loss are the only things that been shown to have data regarding lifestyle modifications. We will trial a two-month period of PPI and then come off. If she is having return of symptoms following this, can try an H2 blocker. She will send me a mitral message regarding return of symptoms if she does experience this. Given her exam, will order another ultrasound. This could be a separate incident from her initial episode in 2017. I will see her based off of the results of the ultrasound. Home stretches and exercises given for the trapezius. Heat was also recommended. Follow-up if no improvement. The patient voiced understanding and agreement to the plan.   [place], DO 07/15/17  4:37 PM

## 2017-07-15 NOTE — Progress Notes (Signed)
Pre visit review using our clinic review tool, if applicable. No additional management support is needed unless otherwise documented below in the visit note. 

## 2017-07-15 NOTE — Patient Instructions (Addendum)
If you do not hear anything about your ultrasound in the next 1-2 weeks, call our office and ask for an update.  Send me a MyChart message after you finish the Omeprazole if symptoms return.  [facility] Ask your health care provider which exercises are safe for you. Do exercises exactly as told by your health care provider and adjust them as directed. It is normal to feel mild stretching, pulling, tightness, or discomfort as you do these exercises, but you should stop right away if you feel sudden pain or your pain gets worse.Do not begin these exercises until told by your health care provider. Stretching and range of motion exercises These exercises warm up your muscles and joints and improve the movement and flexibility of your shoulder. These exercises can also help to relieve pain, numbness, and tingling. If you are unable to do any of the following for any reason, do not further attempt to do it.  Exercise A: Flexion, standing    1. Stand and hold a broomstick, a cane, or a similar object. Place your hands a little more than shoulder-width apart on the object. Your left / right hand should be palm-up, and your other hand should be palm-down. 2. Push the stick to raise your left / right arm out to your side and then over your head. Use your other hand to help move the stick. Stop when you feel a stretch in your shoulder, or when you reach the angle that is recommended by your health care provider. ? Avoid shrugging your shoulder while you raise your arm. Keep your shoulder blade tucked down toward your spine. 3. Hold for 10-15 seconds. 4. Slowly return to the starting position. Repeat 2-3 times. Complete this exercise 1 time a day.  Exercise B: Abduction, supine    1. Lie on your back and hold a broomstick, a cane, or a similar object. Place your hands a little more than shoulder-width apart on the object. Your left / right hand should be palm-up, and your other hand should be  palm-down. 2. Push the stick to raise your left / right arm out to your side and then over your head. Use your other hand to help move the stick. Stop when you feel a stretch in your shoulder, or when you reach the angle that is recommended by your health care provider. ? Avoid shrugging your shoulder while you raise your arm. Keep your shoulder blade tucked down toward your spine. 3. Hold for 10-15 seconds. 4. Slowly return to the starting position. Repeat 2-3 times. Complete this exercise 1 time  a day.  Exercise C: Flexion, active-assisted    1. Lie on your back. You may bend your knees for comfort. 2. Hold a broomstick, a cane, or a similar object. Place your hands about shoulder-width apart on the object. Your palms should face toward your feet. 3. Raise the stick and move your arms over your head and behind your head, toward the floor. Use your healthy arm to help your left / right arm move farther. Stop when you feel a gentle stretch in your shoulder, or when you reach the angle where your health care provider tells you to stop. 4. Hold for 10-15 seconds. 5. Slowly return to the starting position. Repeat 2-3 times. Complete this exercise 1 time  a day.  Exercise D: External rotation and abduction    1. Stand in a door frame with one of your feet slightly in front of the other. This  is called a staggered stance. 2. Choose one of the following positions as told by your health care provider: ? Place your hands and forearms on the door frame above your head. ? Place your hands and forearms on the door frame at the height of your head. ? Place your hands on the door frame at the height of your elbows. 3. Slowly move your weight onto your front foot until you feel a stretch across your chest and in the front of your shoulders. Keep your head and chest upright and keep your abdominal muscles tight. 4. Hold for 10-15 seconds. 5. To release the stretch, shift your weight to your back  foot. Repeat 2-3 times. Complete this stretch 1 time  a day.  Strengthening exercises These exercises build strength and endurance in your shoulder. Endurance is the ability to use your muscles for a long time, even after your muscles get tired. Exercise E: Scapular depression and adduction  1. Sit on a stable chair. Support your arms in front of you with pillows, armrests, or a tabletop. Keep your elbows in line with the sides of your body. 2. Gently move your shoulder blades down toward your middle back. Relax the muscles on the tops of your shoulders and in the back of your neck. 3. Hold for 10-15 seconds. 4. Slowly release the tension and relax your muscles completely before doing this exercise again. 5. After you have practiced this exercise, try doing the exercise without the arm support. Then, try the exercise while standing instead of sitting. Repeat 2-3 times. Complete this exercise 1 time  a day.  Exercise F: Shoulder abduction, isometric    1. Stand or sit about 4-6 inches (10-15 cm) from a wall with your left / right side facing the wall. 2. Bend your left / right elbow and gently press your elbow against the wall. 3. Increase the pressure slowly until you are pressing as hard as you can without shrugging your shoulder. 4. Hold for 10-15 seconds. 5. Slowly release the tension and relax your muscles completely. Repeat 2-3 times. Complete this exercise 1 time  a day.  Exercise G: Shoulder flexion, isometric    1. Stand or sit about 4-6 inches (10-15 cm) away from a wall with your left / right side facing the wall. 2. Keep your left / right elbow straight and gently press the top of your fist against the wall. Increase the pressure slowly until you are pressing as hard as you can without shrugging your shoulder. 3. Hold for 10-15 seconds. 4. Slowly release the tension and relax your muscles completely. Repeat 2-3 times. Complete this exercise 1 time  a day.  Exercise H:  Internal rotation    1. Sit in a stable chair without armrests, or stand. Secure an exercise band at your left / right side, at elbow height. 2. Place a soft object, such as a folded towel or a small pillow, under your left / right upper arm so your elbow is a few inches (about 8 cm) away from your side. 3. Hold the end of the exercise band so the band stretches. 4. Keeping your elbow pressed against the soft object under your arm, move your forearm across your body toward your abdomen. Keep your body steady so the movement is only coming from your shoulder. 5. Hold for 10-15 seconds. 6. Slowly return to the starting position. Repeat 2-3 times. Complete this exercise 1 time  a day.  Exercise I: External rotation  1. Sit in a stable chair without armrests, or stand. 2. Secure an exercise band at your left / right side, at elbow height. 3. Place a soft object, such as a folded towel or a small pillow, under your left / right upper arm so your elbow is a few inches (about 8 cm) away from your side. 4. Hold the end of the exercise band so the band stretches. 5. Keeping your elbow pressed against the soft object under your arm, move your forearm out, away from your abdomen. Keep your body steady so the movement is only coming from your shoulder. 6. Hold for 10-15 seconds. 7. Slowly return to the starting position. Repeat 2-3 times. Complete this exercise 1 time  a day. Exercise J: Shoulder extension  1. Sit in a stable chair without armrests, or stand. Secure an exercise band to a stable object in front of you so the band is at shoulder height. 2. Hold one end of the exercise band in each hand. Your palms should face each other. 3. Straighten your elbows and lift your hands up to shoulder height. 4. Step back, away from the secured end of the exercise band, until the band stretches. 5. Squeeze your shoulder blades together and pull your hands down to the sides of your thighs. Stop when your  hands are straight down by your sides. Do not let your hands go behind your body. 6. Hold for 10-15 seconds. 7. Slowly return to the starting position. Repeat 2-3 times. Complete this exercise 1 time  a day.  Exercise K: Shoulder extension, prone    1. Lie on your abdomen on a firm surface so your left / right arm hangs over the edge. 2. Hold a 5 lb weight in your hand so your palm faces in toward your body. Your arm should be straight. 3. Squeeze your shoulder blade down toward the middle of your back. 4. Slowly raise your arm behind you, up to the height of the surface that you are lying on. Keep your arm straight. 5. Hold for 10-15 seconds. 6. Slowly return to the starting position and relax your muscles. Repeat 2-3 times. Complete this exercise 1 time  a day.  Exercise L: Horizontal abduction, prone  1. Lie on your abdomen on a firm surface so your left / right arm hangs over the edge. 2. Hold a 5 lb weight in your hand so your palm faces toward your feet. Your arm should be straight. 3. Squeeze your shoulder blade down toward the middle of your back. 4. Bend your elbow so your hand moves up, until your elbow is bent to an "L" shape (90 degrees). With your elbow bent, slowly move your forearm forward and up. Raise your hand up to the height of the surface that you are lying on. ? Your upper arm should not move, and your elbow should stay bent. ? At the top of the movement, your palm should face the floor. 5. Hold for 10-15 seconds. 6. Slowly return to the starting position and relax your muscles. Repeat 2-3 times. Complete this exercise 1 time a day.  Exercise M: Horizontal abduction, standing  1. Sit on a stable chair, or stand. 2. Secure an exercise band to a stable object in front of you so the band is at shoulder height. 3. Hold one end of the exercise band in each hand. 4. Straighten your elbows and lift your hands straight in front of you, up to shoulder height. Your palms  should face down, toward the floor. 5. Step back, away from the secured end of the exercise band, until the band stretches. 6. Move your arms out to your sides, and keep your arms straight. 7. Hold for 10-15 seconds. 8. Slowly return to the starting position. Repeat 2-3 times. Complete this exercise 1 time a day.  Exercise N: Scapular retraction and elevation  1. Sit on a stable chair, or stand. 2. Secure an exercise band to a stable object in front of you so the band is at shoulder height. 3. Hold one end of the exercise band in each hand. Your palms should face each other. 4. Sit in a stable chair without armrests, or stand. 5. Step back, away from the secured end of the exercise band, until the band stretches. 6. Squeeze your shoulder blades together and lift your hands over your head. Keep your elbows straight. 7. Hold for 10-15 seconds. 8. Slowly return to the starting position. Repeat 3-4 times. Complete this exercise 1-2 times a day.  This information is not intended to replace advice given to you by your health care provider. Make sure you discuss any questions you have with your health care provider. Document Released: 10/08/2005 Document Revised: 06/14/2016 Document Reviewed: 08/25/2015 Elsevier Interactive Patient Education  2017 [employer].

## 2017-07-16 ENCOUNTER — Encounter: Payer: Self-pay | Admitting: Family Medicine

## 2017-07-16 ENCOUNTER — Ambulatory Visit (HOSPITAL_BASED_OUTPATIENT_CLINIC_OR_DEPARTMENT_OTHER)
Admission: RE | Admit: 2017-07-16 | Discharge: 2017-07-16 | Disposition: A | Discharge status: Home / Self Care | Payer: 59 | Source: Ambulatory Visit | Attending: Family Medicine | Admitting: Family Medicine

## 2017-07-16 DIAGNOSIS — R1011 Right upper quadrant pain: Secondary | ICD-10-CM | POA: Insufficient documentation

## 2017-07-16 LAB — COMPREHENSIVE METABOLIC PANEL
ALT: 12 U/L (ref 0–35)
AST: 13 U/L (ref 0–37)
Albumin: 4.3 g/dL (ref 3.5–5.2)
Alkaline Phosphatase: 66 U/L (ref 39–117)
BUN: 10 mg/dL (ref 6–23)
CO2: 28 mEq/L (ref 19–32)
Calcium: 9.3 mg/dL (ref 8.4–10.5)
Chloride: 104 mEq/L (ref 96–112)
Creatinine, Ser: 0.83 mg/dL (ref 0.40–1.20)
GFR: 83.87 mL/min (ref >60.00)
Glucose, Bld: 66 mg/dL — ABNORMAL LOW (ref 70–99)
Potassium: 3.6 mEq/L (ref 3.5–5.1)
Sodium: 140 mEq/L (ref 135–145)
Total Bilirubin: 0.2 mg/dL (ref 0.2–1.2)
Total Protein: 6.9 g/dL (ref 6.0–8.3)

## 2017-07-16 LAB — CBC
HCT: 43.4 % (ref 36.0–46.0)
Hemoglobin: 14.1 g/dL (ref 12.0–15.0)
MCHC: 32.4 g/dL (ref 30.0–36.0)
MCV: 84.4 fl (ref 78.0–100.0)
Platelets: 324 10*3/uL (ref 150.0–400.0)
RBC: 5.14 Mil/uL — ABNORMAL HIGH (ref 3.87–5.11)
RDW: 12.9 % (ref 11.5–15.5)
WBC: 9.6 10*3/uL (ref 4.0–10.5)

## 2017-07-30 ENCOUNTER — Encounter: Payer: Self-pay | Admitting: Family Medicine

## 2017-08-02 ENCOUNTER — Telehealth: Payer: Self-pay | Admitting: *Deleted

## 2017-08-02 DIAGNOSIS — B373 Candidiasis of vulva and vagina: Secondary | ICD-10-CM

## 2017-08-02 DIAGNOSIS — B3731 Acute candidiasis of vulva and vagina: Secondary | ICD-10-CM

## 2017-08-02 DIAGNOSIS — Z3041 Encounter for surveillance of contraceptive pills: Secondary | ICD-10-CM

## 2017-08-02 MED ORDER — NORETHINDRONE ACET-ETHINYL EST 1-20 MG-MCG PO TABS: 1.0000 | ORAL_TABLET | Freq: Every day | ORAL | 12 refills | Status: DC

## 2017-08-02 MED ORDER — FLUCONAZOLE 150 MG PO TABS: ORAL_TABLET | ORAL | 0 refills | Status: DC

## 2017-08-02 MED FILL — NORETHIND-ETH ESTRAD 1-0.02: 1-20 | 63 days supply | Qty: 63 | Fill #0

## 2017-08-02 MED FILL — FLUCONAZOLE 150 MG TABLET: 150 | 2 days supply | Qty: 2 | Fill #0

## 2017-08-02 NOTE — Telephone Encounter (Signed)
RF on OCP's and a new RX for Diflucan sent to [facility] per VO of Dr Hulan Fray.

## 2017-09-09 ENCOUNTER — Ambulatory Visit (INDEPENDENT_AMBULATORY_CARE_PROVIDER_SITE_OTHER): Payer: 59 | Admitting: Obstetrics & Gynecology

## 2017-09-09 ENCOUNTER — Encounter: Payer: Self-pay | Admitting: Obstetrics & Gynecology

## 2017-09-09 DIAGNOSIS — Z1151 Encounter for screening for human papillomavirus (HPV): Secondary | ICD-10-CM | POA: Diagnosis not present

## 2017-09-09 DIAGNOSIS — Z124 Encounter for screening for malignant neoplasm of cervix: Secondary | ICD-10-CM | POA: Diagnosis not present

## 2017-09-09 DIAGNOSIS — R635 Abnormal weight gain: Secondary | ICD-10-CM

## 2017-09-09 DIAGNOSIS — R102 Pelvic and perineal pain: Secondary | ICD-10-CM

## 2017-09-09 DIAGNOSIS — Z01419 Encounter for gynecological examination (general) (routine) without abnormal findings: Secondary | ICD-10-CM | POA: Diagnosis not present

## 2017-09-09 DIAGNOSIS — L659 Nonscarring hair loss, unspecified: Secondary | ICD-10-CM

## 2017-09-10 ENCOUNTER — Ambulatory Visit (HOSPITAL_BASED_OUTPATIENT_CLINIC_OR_DEPARTMENT_OTHER)
Admission: RE | Admit: 2017-09-10 | Discharge: 2017-09-10 | Disposition: A | Discharge status: Home / Self Care | Payer: 59 | Source: Ambulatory Visit | Attending: Obstetrics & Gynecology | Admitting: Obstetrics & Gynecology

## 2017-09-10 DIAGNOSIS — L659 Nonscarring hair loss, unspecified: Secondary | ICD-10-CM | POA: Insufficient documentation

## 2017-09-10 DIAGNOSIS — N8302 Follicular cyst of left ovary: Secondary | ICD-10-CM | POA: Diagnosis not present

## 2017-09-10 DIAGNOSIS — R635 Abnormal weight gain: Secondary | ICD-10-CM | POA: Insufficient documentation

## 2017-09-10 DIAGNOSIS — R102 Pelvic and perineal pain: Secondary | ICD-10-CM | POA: Diagnosis not present

## 2017-09-10 NOTE — Progress Notes (Signed)
Subjective:     Kimberly Orr is a 31 y.o. female here for a routine exam.  Current complaints: 12 pound weight gain in the past 6 months, hair thinning, abdominal bloating, and right lower quadrant pulling / pain.    Gynecologic History No LMP recorded. Contraception: OCP (estrogen/progesterone) Last Pap: 2017. Results were: normal (Pt believes she needs pap for insurance)  Obstetric History OB History  Gravida Para Term Preterm AB Living  2 2 2     2   SAB TAB Ectopic Multiple Live Births               # Outcome Date GA Lbr Len/2nd Weight Sex Delivery Anes PTL Lv  2 Term           1 Term                The following portions of the patient's history were reviewed and updated as appropriate: allergies, current medications, past family history, past medical history, past social history, past surgical history and problem list.  Review of Systems Pertinent items noted in HPI and remainder of comprehensive ROS otherwise negative.    Objective:     There were no vitals filed for this visit. Vitals:  WNL General appearance: alert, cooperative and no distress  HEENT: Normocephalic, without obvious abnormality, atraumatic Eyes: negative Throat: lips, mucosa, and tongue normal; teeth and gums normal  Respiratory: Clear to auscultation bilaterally  CV: Regular rate and rhythm  Breasts:  Normal appearance, no masses or tenderness, no nipple retraction or dimpling  GI: Soft, non-tender; bowel sounds normal; no masses,  no organomegaly  GU: External Genitalia:  Tanner V, no lesion Urethra:  No prolapse   Vagina: Pink, normal rugae, no blood or discharge  Cervix: No CMT, no lesion  Uterus:  Normal size and contour, non tender  Adnexa: Normal, no masses, non tender; right size larger than left but unsure if abnlrmally large  Musculoskeletal: No edema, redness or tenderness in the calves or thighs  Skin: No lesions or rash, sun damage present on chest  Lymphatic: Axillary  adenopathy: none     Psychiatric: Normal mood and behavior    Assessment:    Healthy female exam.    Plan:    Pap with cotesting; if nml does not need pap for at least 3 years Weight gain--TSH, reviewed counting calories, increasing exercise. Right sided pelvic pain for 3 months--TVUS Bowel and bladder habits reviewed and nml.   Can consider phenteramine if ewight loss not successful with calorie counting.

## 2017-09-11 ENCOUNTER — Telehealth: Payer: Self-pay | Admitting: *Deleted

## 2017-09-11 DIAGNOSIS — R102 Pelvic and perineal pain: Secondary | ICD-10-CM | POA: Diagnosis not present

## 2017-09-11 DIAGNOSIS — L659 Nonscarring hair loss, unspecified: Secondary | ICD-10-CM | POA: Diagnosis not present

## 2017-09-11 DIAGNOSIS — Z01419 Encounter for gynecological examination (general) (routine) without abnormal findings: Secondary | ICD-10-CM | POA: Diagnosis not present

## 2017-09-11 LAB — CULTURE, URINE COMPREHENSIVE
MICRO NUMBER:: 81303356
SPECIMEN QUALITY:: ADEQUATE

## 2017-09-11 LAB — CYTOLOGY - PAP
Diagnosis: NEGATIVE
HPV: NOT DETECTED

## 2017-09-11 NOTE — Telephone Encounter (Signed)
-----   Message from Guss Bunde, MD sent at 09/11/2017  7:59 AM EST ----- No evidence of abnormality.  RN Carlis Abbott to call patient

## 2017-09-11 NOTE — Addendum Note (Signed)
Addended by: Asencion Islam on: 09/11/2017 07:58 AM   Modules accepted: Orders

## 2017-09-11 NOTE — Telephone Encounter (Signed)
Pt notified of normal TVU and fasting labs drawn today.

## 2017-09-12 LAB — COMPREHENSIVE METABOLIC PANEL
ALT: 22 IU/L (ref 0–32)
AST: 17 IU/L (ref 0–40)
Albumin/Globulin Ratio: 2.2 (ref 1.2–2.2)
Albumin: 4.3 g/dL (ref 3.5–5.5)
Alkaline Phosphatase: 85 IU/L (ref 39–117)
BUN/Creatinine Ratio: 14 (ref 9–23)
BUN: 12 mg/dL (ref 6–20)
Bilirubin Total: 0.3 mg/dL (ref 0.0–1.2)
CO2: 19 mmol/L — ABNORMAL LOW (ref 20–29)
Calcium: 9.3 mg/dL (ref 8.7–10.2)
Chloride: 109 mmol/L — ABNORMAL HIGH (ref 96–106)
Creatinine, Ser: 0.86 mg/dL (ref 0.57–1.00)
GFR calc Af Amer: 103 mL/min/{1.73_m2} (ref >59)
GFR calc non Af Amer: 89 mL/min/{1.73_m2} (ref >59)
Globulin, Total: 2 g/dL (ref 1.5–4.5)
Glucose: 97 mg/dL (ref 65–99)
Potassium: 4.1 mmol/L (ref 3.5–5.2)
Sodium: 143 mmol/L (ref 134–144)
Total Protein: 6.3 g/dL (ref 6.0–8.5)

## 2017-09-12 LAB — CBC
Hematocrit: 41.4 % (ref 34.0–46.6)
Hemoglobin: 14 g/dL (ref 11.1–15.9)
MCH: 27.5 pg (ref 26.6–33.0)
MCHC: 33.8 g/dL (ref 31.5–35.7)
MCV: 81 fL (ref 79–97)
Platelets: 308 10*3/uL (ref 150–379)
RBC: 5.1 x10E6/uL (ref 3.77–5.28)
RDW: 13.5 % (ref 12.3–15.4)
WBC: 7.8 10*3/uL (ref 3.4–10.8)

## 2017-09-12 LAB — LIPID PANEL
Chol/HDL Ratio: 2.6 ratio (ref 0.0–4.4)
Cholesterol, Total: 154 mg/dL (ref 100–199)
HDL: 59 mg/dL (ref >39)
LDL Calculated: 82 mg/dL (ref 0–99)
Triglycerides: 67 mg/dL (ref 0–149)
VLDL Cholesterol Cal: 13 mg/dL (ref 5–40)

## 2017-09-12 LAB — TSH: TSH: 2.65 u[IU]/mL (ref 0.450–4.500)

## 2017-09-12 LAB — VITAMIN D 25 HYDROXY (VIT D DEFICIENCY, FRACTURES): Vit D, 25-Hydroxy: 33.6 ng/mL (ref 30.0–100.0)

## 2017-09-24 ENCOUNTER — Telehealth: Payer: Self-pay | Admitting: *Deleted

## 2017-09-24 MED ORDER — PHENTERMINE HCL 37.5 MG PO TABS: 37.5000 mg | ORAL_TABLET | Freq: Every day | ORAL | 1 refills | Status: DC

## 2017-09-24 NOTE — Telephone Encounter (Signed)
Pt had requested a RX for Phentermine 37.5 mg for weight loss.  Per VO Dr Gala Romney has ordered it and written prescription will be sent to pt.

## 2017-09-24 NOTE — Telephone Encounter (Signed)
-----   Message from Guss Bunde, MD sent at 09/24/2017  2:16 PM EST ----- Yes, order the phenteramine. Glad she is out of pain.  ----- Message ----- From: Asencion Islam, RN Sent: 09/24/2017   1:48 PM To: Guss Bunde, MD  The lab was from [facility] not [facility] which we originally thought.  That said it was a contaminated specimen.  Usually seen with flakes of stool.  She said the only reason she sent it was because of the pelvic pain she was having.  No pain now.  She also wanted to know whether you would go ahead with the Phentermine??? ----- Message ----- From: Guss Bunde, MD Sent: 09/24/2017  12:57 PM To: Asencion Islam, RN  Did you call lab about Mady Haagensen urine culture.  Is she having symptoms?

## 2017-10-10 MED FILL — PHENTERMINE 37.5 MG TABLET: 37.5 | 30 days supply | Qty: 30 | Fill #0

## 2017-10-31 ENCOUNTER — Other Ambulatory Visit: Payer: Self-pay | Admitting: *Deleted

## 2017-10-31 DIAGNOSIS — D509 Iron deficiency anemia, unspecified: Secondary | ICD-10-CM

## 2017-12-17 DIAGNOSIS — D225 Melanocytic nevi of trunk: Secondary | ICD-10-CM | POA: Diagnosis not present

## 2017-12-24 ENCOUNTER — Telehealth: Payer: Self-pay | Admitting: *Deleted

## 2017-12-24 MED ORDER — OSELTAMIVIR PHOSPHATE 75 MG PO CAPS: 75.0000 mg | ORAL_CAPSULE | Freq: Every day | ORAL | 0 refills | Status: DC

## 2017-12-24 MED FILL — OSELTAMIVIR PHOSPHATE 75 MG: 75 | 10 days supply | Qty: 10 | Fill #0

## 2017-12-24 NOTE — Telephone Encounter (Signed)
Pt called stating that her daughter just testing positive for Influenza A and was wanting to get a RX for Tamiflu for herself.  RX sent to [facility] for 75 mg Daily for 5 days.

## 2018-01-13 ENCOUNTER — Ambulatory Visit (INDEPENDENT_AMBULATORY_CARE_PROVIDER_SITE_OTHER): Payer: 59 | Admitting: Family Medicine

## 2018-01-13 ENCOUNTER — Encounter: Payer: Self-pay | Admitting: Family Medicine

## 2018-01-13 ENCOUNTER — Other Ambulatory Visit (INDEPENDENT_AMBULATORY_CARE_PROVIDER_SITE_OTHER): Payer: 59

## 2018-01-13 VITALS — BP 112/78 | HR 75 | Temp 97.5°F | Ht 65.5 in | Wt 174.4 lb

## 2018-01-13 DIAGNOSIS — D72828 Other elevated white blood cell count: Secondary | ICD-10-CM | POA: Diagnosis not present

## 2018-01-13 DIAGNOSIS — Z0000 Encounter for general adult medical examination without abnormal findings: Secondary | ICD-10-CM | POA: Diagnosis not present

## 2018-01-13 LAB — COMPREHENSIVE METABOLIC PANEL
ALT: 19 U/L (ref 0–35)
AST: 19 U/L (ref 0–37)
Albumin: 4 g/dL (ref 3.5–5.2)
Alkaline Phosphatase: 72 U/L (ref 39–117)
BUN: 8 mg/dL (ref 6–23)
CO2: 29 mEq/L (ref 19–32)
Calcium: 9.4 mg/dL (ref 8.4–10.5)
Chloride: 105 mEq/L (ref 96–112)
Creatinine, Ser: 0.76 mg/dL (ref 0.40–1.20)
GFR: 92.56 mL/min (ref >60.00)
Glucose, Bld: 93 mg/dL (ref 70–99)
Potassium: 4.6 mEq/L (ref 3.5–5.1)
Sodium: 140 mEq/L (ref 135–145)
Total Bilirubin: 0.3 mg/dL (ref 0.2–1.2)
Total Protein: 6.9 g/dL (ref 6.0–8.3)

## 2018-01-13 LAB — CBC WITH DIFFERENTIAL/PLATELET
Basophils Absolute: 0.1 10*3/uL (ref 0.0–0.1)
Basophils Relative: 0.7 % (ref 0.0–3.0)
Eosinophils Absolute: 0.1 10*3/uL (ref 0.0–0.7)
Eosinophils Relative: 1.1 % (ref 0.0–5.0)
HCT: 42.7 % (ref 36.0–46.0)
Hemoglobin: 13.8 g/dL (ref 12.0–15.0)
Lymphocytes Relative: 40.1 % (ref 12.0–46.0)
Lymphs Abs: 4.5 10*3/uL — ABNORMAL HIGH (ref 0.7–4.0)
MCHC: 32.3 g/dL (ref 30.0–36.0)
MCV: 84.3 fl (ref 78.0–100.0)
Monocytes Absolute: 0.7 10*3/uL (ref 0.1–1.0)
Monocytes Relative: 6 % (ref 3.0–12.0)
Neutro Abs: 5.9 10*3/uL (ref 1.4–7.7)
Neutrophils Relative %: 52.1 % (ref 43.0–77.0)
Platelets: 338 10*3/uL (ref 150.0–400.0)
RBC: 5.06 Mil/uL (ref 3.87–5.11)
RDW: 12.6 % (ref 11.5–15.5)
WBC: 11.3 10*3/uL — ABNORMAL HIGH (ref 4.0–10.5)

## 2018-01-13 LAB — LIPID PANEL
Cholesterol: 143 mg/dL (ref 0–200)
HDL: 48.7 mg/dL (ref >39.00)
LDL Cholesterol: 84 mg/dL (ref 0–99)
NonHDL: 94.35
Total CHOL/HDL Ratio: 3
Triglycerides: 50 mg/dL (ref 0.0–149.0)
VLDL: 10 mg/dL (ref 0.0–40.0)

## 2018-01-13 LAB — CBC
HCT: 41.6 % (ref 36.0–46.0)
Hemoglobin: 13.9 g/dL (ref 12.0–15.0)
MCHC: 33.3 g/dL (ref 30.0–36.0)
MCV: 83.2 fl (ref 78.0–100.0)
Platelets: 352 10*3/uL (ref 150.0–400.0)
RBC: 5 Mil/uL (ref 3.87–5.11)
RDW: 12.9 % (ref 11.5–15.5)
WBC: 11.2 10*3/uL — ABNORMAL HIGH (ref 4.0–10.5)

## 2018-01-13 NOTE — Patient Instructions (Signed)
Pectoralis Major Rehab  It is normal to feel mild stretching, pulling, tightness, or discomfort as you do these exercises, but you should stop right away if you feel sudden pain or your pain gets worse. Stretching and range of motion exercises These exercises warm up your muscles and joints and improve the movement and flexibility of your shoulder. These exercises can also help to relieve pain, numbness, and tingling. Exercise A: Pendulum  1. Stand near a wall or a surface that you can hold onto for balance. 2. Bend at the waist and let your left / right arm hang straight down. Use your other arm to keep your balance. 3. Relax your arm and shoulder muscles, and move your hips and your trunk so your left / right arm swings freely. Your arm should swing because of the motion of your body, not because you are using your arm or shoulder muscles. 4. Keep moving so your arm swings in the following directions, as told by your health care provider: ? Side to side. ? Forward and backward. ? In clockwise and counterclockwise circles. 5. Slowly return to the starting position. Repeat 2 times. Complete this exercise 3 times per week. Exercise B: Abduction, standing 1. Stand and hold a broomstick, a cane, or a similar object. Place your hands a little more than shoulder-width apart on the object. Your left / right hand should be palm-up, and your other hand should be palm-down. 2. While keeping your elbow straight and your shoulder muscles relaxed, push the stick across your body toward your left / right side. Raise your left / right arm to the side of your body and then over your head until you feel a stretch in your shoulder. ? Stop when you reach the angle that is recommended by your health care provider. ? Avoid shrugging your shoulder while you raise your arm. Keep your shoulder blade tucked down toward the middle of your spine. 3. Hold for 10 seconds. 4. Slowly return to the starting position. Repeat  2 times. Complete this exercise 3 times per week. Exercise C: Wand flexion, supine  1. Lie on your back. You may bend your knees for comfort. 2. Hold a broomstick, a cane, or a similar object so that your hands are about shoulder-width apart on the object. Your palms should face toward your feet. 3. Raise your left / right arm in front of your face, then behind your head (toward the floor). Use your other hand to help you do this. Stop when you feel a gentle stretch in your shoulder, or when you reach the angle that is recommended by your health care provider. 4. Hold for 3 seconds. 5. Use the broomstick and your other arm to help you return your left / right arm to the starting position. Repeat 2 times. Complete this exercise 3 times per week. Exercise D: Wand shoulder external rotation 1. Stand and hold a broomstick, a cane, or a similar object so your handsare about shoulder-width apart on the object. 2. Start with your arms hanging down, then bend both elbows to an "L" shape (90 degrees). 3. Keep your left / right elbow at your side. Use your other hand to push the stick so your left / right forearm moves away from your body, out to your side. ? Keep your left / right elbow bent to 90 degrees and keep it against your side. ? Stop when you feel a gentle stretch in your shoulder, or when you reach the angle  recommended by your health care provider. 4. Hold for 10 seconds. 5. Use the stick to help you return your left / right arm to the starting position. Repeat 2 times. Complete this exercise 3 times per week. Strengthening exercises These exercises build strength and endurance in your shoulder. Endurance is the ability to use your muscles for a long time, even after your muscles get tired. Exercise E: Scapular protraction, standing 1. Stand so you are facing a wall. Place your feet about one arm-length away from the wall. 2. Place your hands on the wall and straighten your elbows. 3. Keep  your hands on the wall as you push your upper back away from the wall. You should feel your shoulder blades sliding forward.Keep your elbows and your head still. ? If you are not sure that you are doing this exercise correctly, ask your health care provider for more instructions. 4. Hold for 3 seconds. 5. Slowly return to the starting position. Let your muscles relax completely before you repeat this exercise. Repeat 2 times. Complete this exercise 3 times per week. Exercise F: Shoulder blade squeezes  (scapular retraction) 1. Sit with good posture in a stable chair. Do not let your back touch the back of the chair. 2. Your arms should be at your sides with your elbows bent. You may rest your forearms on a pillow if that is more comfortable. 3. Squeeze your shoulder blades together. Bring them down and back. ? Keep your shoulders level. ? Do not lift your shoulders up toward your ears. 4. Hold for 3 seconds. 5. Return to the starting position. Repeat 2 times. Complete this exercise 3 times per week. Document Released: 10/08/2005 Document Revised: 07/19/2016 Document Reviewed: 06/26/2015 Elsevier Interactive Patient Education  [employer].

## 2018-01-13 NOTE — Progress Notes (Signed)
Chief Complaint  Patient presents with  . Annual Exam     Well Woman Kimberly Orr is here for a complete physical.   Her last physical was >1 year ago.  Current diet: in general, a "healthy" diet. Current exercise: walking, some weight lifting Weight is stable and she denies daytime fatigue. No LMP recorded.  Seatbelt? Yes  Health Maintenance Pap/HPV- Yes Tetanus- Yes HIV screening- Yes - 3 yrs ago  Past Medical History:  Diagnosis Date  . History of chicken pox      Past Surgical History:  Procedure Laterality Date  . DILATION AND CURETTAGE OF UTERUS  2011  . WISDOM TOOTH EXTRACTION      Medications  Current Outpatient Medications on File Prior to Visit  Medication Sig Dispense Refill  . ibuprofen (ADVIL,MOTRIN) 800 MG tablet Take 800 mg by mouth every 8 (eight) hours as needed.    . norethindrone-ethinyl estradiol (MICROGESTIN,JUNEL,LOESTRIN) 1-20 MG-MCG tablet Take 1 tablet by mouth daily. 1 Package 12    Allergies No Known Allergies  Review of Systems: Constitutional:  no fevers Eye:  no recent significant change in vision Ear/Nose/Mouth/Throat:  Ears:  no tinnitus or vertigo and no recent change in hearing, Nose/Mouth/Throat:  no complaints of nasal congestion, no sore throat Cardiovascular: no chest pain, no palpitations Respiratory:  no cough and no shortness of breath Gastrointestinal:  no abdominal pain, no change in bowel habits, no significant change in appetite, no nausea, vomiting, diarrhea, or constipation and no black or bloody stool GU:  Female: negative for dysuria, frequency, and incontinence; no abnormal bleeding, pelvic pain, or discharge Musculoskeletal/Extremities: +R shoulder/ no pain of the joints Integumentary (Skin/Breast):  no abnormal skin lesions reported Neurologic:  no headaches Endocrine:  denies fatigue Hematologic/Lymphatic:  no unexpected weight changes  Exam BP 112/78 (BP Location: Left Arm, Patient Position: Sitting,  Cuff Size: Normal)   Pulse 75   Temp (!) 97.5 F (36.4 C) (Oral)   Ht 5' 5.5" (1.664 m)   Wt 174 lb 6 oz (79.1 kg)   SpO2 97%   BMI 28.58 kg/m  General:  well developed, well nourished, in no apparent distress Skin:  no significant moles, warts, or growths Head:  no masses, lesions, or tenderness Eyes:  pupils equal and round, sclera anicteric without injection Ears:  canals without lesions, TMs shiny without retraction, no obvious effusion, no erythema Nose:  nares patent, septum midline, mucosa normal, and no drainage or sinus tenderness Throat/Pharynx:  lips and gingiva without lesion; tongue and uvula midline; non-inflamed pharynx; no exudates or postnasal drainage Neck: neck supple without adenopathy, thyromegaly, or masses Breasts:  Not done Thorax:  nontender Lungs:  clear to auscultation, breath sounds equal bilaterally, no respiratory distress Cardio:  regular rate and rhythm without murmurs, heart sounds without clicks or rubs, point of maximal impulse normal; no lifts, heaves, or thrills Abdomen:  abdomen soft, nontender; bowel sounds normal; no masses or organomegaly Genital: Defer to GYN Musculoskeletal: Neg Neer's, Hawkins, Cross over, Obrien's, apprehension, lift off; otherwise symmetrical muscle groups noted without atrophy or deformity Extremities:  no clubbing, cyanosis, or edema, no deformities, no skin discoloration Neuro:  gait normal; deep tendon reflexes normal and symmetric Psych: well oriented with normal range of affect and appropriate judgment/insight  Assessment and Plan  Well adult exam - Plan: CBC, Comprehensive metabolic panel, Lipid panel   Well 32 y.o. female. Counseled on diet and exercise. Stretches and exercises given for pec as shoulder exercises not quite helpful,  she will let us know if she is interested in seeing PT or sports med.  Other orders as above. Follow up in 1 yr or prn. The patient voiced understanding and agreement to the  plan.  [place], DO 01/13/18 1:40 PM

## 2018-01-13 NOTE — Progress Notes (Signed)
Pre visit review using our clinic review tool, if applicable. No additional management support is needed unless otherwise documented below in the visit note. 

## 2018-01-15 ENCOUNTER — Other Ambulatory Visit: Payer: Self-pay | Admitting: Family Medicine

## 2018-01-15 ENCOUNTER — Encounter: Payer: Self-pay | Admitting: Family Medicine

## 2018-01-15 DIAGNOSIS — D72828 Other elevated white blood cell count: Secondary | ICD-10-CM

## 2018-01-29 MED FILL — PHENTERMINE 37.5 MG TABLET: 37.5 | 30 days supply | Qty: 30 | Fill #1

## 2018-01-29 MED FILL — NORETHIND-ETH ESTRAD 1-0.02: 1-20 | 63 days supply | Qty: 63 | Fill #1

## 2018-02-03 ENCOUNTER — Other Ambulatory Visit (INDEPENDENT_AMBULATORY_CARE_PROVIDER_SITE_OTHER): Payer: 59

## 2018-02-03 DIAGNOSIS — D72828 Other elevated white blood cell count: Secondary | ICD-10-CM

## 2018-02-03 LAB — CBC WITH DIFFERENTIAL/PLATELET
Basophils Absolute: 0.1 10*3/uL (ref 0.0–0.1)
Basophils Relative: 0.7 % (ref 0.0–3.0)
Eosinophils Absolute: 0.1 10*3/uL (ref 0.0–0.7)
Eosinophils Relative: 0.9 % (ref 0.0–5.0)
HCT: 41.2 % (ref 36.0–46.0)
Hemoglobin: 13.6 g/dL (ref 12.0–15.0)
Lymphocytes Relative: 37.8 % (ref 12.0–46.0)
Lymphs Abs: 3.6 10*3/uL (ref 0.7–4.0)
MCHC: 32.9 g/dL (ref 30.0–36.0)
MCV: 83.9 fl (ref 78.0–100.0)
Monocytes Absolute: 0.6 10*3/uL (ref 0.1–1.0)
Monocytes Relative: 6 % (ref 3.0–12.0)
Neutro Abs: 5.2 10*3/uL (ref 1.4–7.7)
Neutrophils Relative %: 54.6 % (ref 43.0–77.0)
Platelets: 313 10*3/uL (ref 150.0–400.0)
RBC: 4.91 Mil/uL (ref 3.87–5.11)
RDW: 12.9 % (ref 11.5–15.5)
WBC: 9.5 10*3/uL (ref 4.0–10.5)

## 2018-02-04 LAB — PATHOLOGIST SMEAR REVIEW

## 2018-02-17 ENCOUNTER — Other Ambulatory Visit: Payer: 59

## 2018-04-18 MED FILL — NORETHIND-ETH ESTRAD 1-0.02: 1-20 | 63 days supply | Qty: 63 | Fill #2

## 2018-07-23 MED FILL — NORETHIND-ETH ESTRAD 1-0.02: 1-20 | 63 days supply | Qty: 63 | Fill #3

## 2018-10-16 ENCOUNTER — Other Ambulatory Visit: Payer: Self-pay | Admitting: Obstetrics & Gynecology

## 2018-10-16 DIAGNOSIS — Z3041 Encounter for surveillance of contraceptive pills: Secondary | ICD-10-CM

## 2018-10-17 ENCOUNTER — Other Ambulatory Visit: Payer: Self-pay | Admitting: *Deleted

## 2018-10-17 DIAGNOSIS — Z3041 Encounter for surveillance of contraceptive pills: Secondary | ICD-10-CM

## 2018-10-17 MED ORDER — NORETHINDRONE ACET-ETHINYL EST 1-20 MG-MCG PO TABS: 1.0000 | ORAL_TABLET | Freq: Every day | ORAL | 12 refills | Status: DC

## 2018-10-17 MED FILL — NORETHIND-ETH ESTRAD 1-0.02: 1-20 | 63 days supply | Qty: 63 | Fill #0

## 2019-01-06 DIAGNOSIS — D239 Other benign neoplasm of skin, unspecified: Secondary | ICD-10-CM | POA: Diagnosis not present

## 2019-01-06 DIAGNOSIS — L578 Other skin changes due to chronic exposure to nonionizing radiation: Secondary | ICD-10-CM | POA: Diagnosis not present

## 2019-01-06 DIAGNOSIS — D2272 Melanocytic nevi of left lower limb, including hip: Secondary | ICD-10-CM | POA: Diagnosis not present

## 2019-01-06 DIAGNOSIS — D2271 Melanocytic nevi of right lower limb, including hip: Secondary | ICD-10-CM | POA: Diagnosis not present

## 2019-01-06 DIAGNOSIS — D225 Melanocytic nevi of trunk: Secondary | ICD-10-CM | POA: Diagnosis not present

## 2019-01-06 DIAGNOSIS — D2262 Melanocytic nevi of left upper limb, including shoulder: Secondary | ICD-10-CM | POA: Diagnosis not present

## 2019-01-06 DIAGNOSIS — D2261 Melanocytic nevi of right upper limb, including shoulder: Secondary | ICD-10-CM | POA: Diagnosis not present

## 2019-01-14 MED FILL — NORETHIND-ETH ESTRAD 1-0.02: 1-20 | 84 days supply | Qty: 84 | Fill #0

## 2019-01-16 ENCOUNTER — Encounter: Payer: 59 | Admitting: Family Medicine

## 2019-04-03 ENCOUNTER — Encounter: Payer: Self-pay | Admitting: Family Medicine

## 2019-04-03 ENCOUNTER — Ambulatory Visit (INDEPENDENT_AMBULATORY_CARE_PROVIDER_SITE_OTHER): Payer: 59 | Admitting: Family Medicine

## 2019-04-03 ENCOUNTER — Other Ambulatory Visit: Payer: Self-pay

## 2019-04-03 VITALS — BP 108/68 | HR 88 | Temp 97.6°F | Ht 65.0 in | Wt 187.5 lb

## 2019-04-03 DIAGNOSIS — Z0000 Encounter for general adult medical examination without abnormal findings: Secondary | ICD-10-CM | POA: Diagnosis not present

## 2019-04-03 NOTE — Patient Instructions (Signed)
Give us 2-3 business days to get the results of your labs back.   Keep the diet clean and stay active.  Let us know if you need anything. 

## 2019-04-03 NOTE — Progress Notes (Signed)
Chief Complaint  Patient presents with  . Annual Exam     Well Woman Kimberly Orr is here for a complete physical.   Her last physical was >1 year ago.  Current diet: in general, diet is OK. Current exercise: HIIT. Contraception? Yes No LMP recorded.  Seatbelt? Yes  Health Maintenance Pap/HPV- Yes Tetanus- Yes HIV screening- Yes  Past Medical History:  Diagnosis Date  . History of chicken pox      Past Surgical History:  Procedure Laterality Date  . DILATION AND CURETTAGE OF UTERUS  2011  . WISDOM TOOTH EXTRACTION      Medications  Current Outpatient Medications on File Prior to Visit  Medication Sig Dispense Refill  . ibuprofen (ADVIL,MOTRIN) 800 MG tablet Take 800 mg by mouth every 8 (eight) hours as needed.    . norethindrone-ethinyl estradiol (MICROGESTIN,JUNEL,LOESTRIN) 1-20 MG-MCG tablet TAKE 1 TABLET BY MOUTH DAILY. 21 tablet 12  . norethindrone-ethinyl estradiol (MICROGESTIN,JUNEL,LOESTRIN) 1-20 MG-MCG tablet Take 1 tablet by mouth daily. 1 Package 12   Allergies No Known Allergies  Review of Systems: Constitutional:  no unexpected weight changes Eye:  no recent significant change in vision Ear/Nose/Mouth/Throat:  Ears:  no tinnitus or vertigo and no recent change in hearing Nose/Mouth/Throat:  no complaints of nasal congestion, no sore throat Cardiovascular: no chest pain Respiratory:  no cough and no shortness of breath Gastrointestinal:  no abdominal pain, no change in bowel habits GU:  Female: negative for dysuria or pelvic pain Musculoskeletal/Extremities:  no pain of the joints Integumentary (Skin/Breast):  no abnormal skin lesions reported Neurologic:  no headaches Endocrine:  denies fatigue Hematologic/Lymphatic:  No areas of easy bleeding  Exam BP 108/68 (BP Location: Left Arm, Patient Position: Sitting, Cuff Size: Normal)   Pulse 88   Temp 97.6 F (36.4 C) (Oral)   Ht 5\' 5"  (1.651 m)   Wt 187 lb 8 oz (85 kg)   SpO2 98%   BMI 31.20  kg/m  General:  well developed, well nourished, in no apparent distress Skin:  no significant moles, warts, or growths Head:  no masses, lesions, or tenderness Eyes:  pupils equal and round, sclera anicteric without injection Ears:  canals without lesions, TMs shiny without retraction, no obvious effusion, no erythema Nose:  nares patent, septum midline, mucosa normal, and no drainage or sinus tenderness Throat/Pharynx:  lips and gingiva without lesion; tongue and uvula midline; non-inflamed pharynx; no exudates or postnasal drainage Neck: neck supple without adenopathy, thyromegaly, or masses Lungs:  clear to auscultation, breath sounds equal bilaterally, no respiratory distress Cardio:  regular rate and rhythm, no bruits, no LE edema Abdomen:  abdomen soft, nontender; bowel sounds normal; no masses or organomegaly Genital: Defer to GYN Musculoskeletal:  symmetrical muscle groups noted without atrophy or deformity Extremities:  no clubbing, cyanosis, or edema, no deformities, no skin discoloration Neuro:  gait normal; deep tendon reflexes normal and symmetric Psych: well oriented with normal range of affect and appropriate judgment/insight  Assessment and Plan  Well adult exam - Plan: Comprehensive metabolic panel, CBC, Lipid panel, TSH, Vitamin D (25 hydroxy)  Well 33 y.o. female. Counseled on diet and exercise. Other orders as above. Follow up in 1 yr or prn. The patient voiced understanding and agreement to the plan.  [place], DO 04/03/19 1:38 PM

## 2019-04-15 ENCOUNTER — Other Ambulatory Visit: Payer: Self-pay

## 2019-04-15 ENCOUNTER — Other Ambulatory Visit (INDEPENDENT_AMBULATORY_CARE_PROVIDER_SITE_OTHER): Payer: 59

## 2019-04-15 DIAGNOSIS — Z0000 Encounter for general adult medical examination without abnormal findings: Secondary | ICD-10-CM | POA: Diagnosis not present

## 2019-04-15 LAB — COMPREHENSIVE METABOLIC PANEL
ALT: 12 U/L (ref 0–35)
AST: 21 U/L (ref 0–37)
Albumin: 4.3 g/dL (ref 3.5–5.2)
Alkaline Phosphatase: 72 U/L (ref 39–117)
BUN: 12 mg/dL (ref 6–23)
CO2: 24 mEq/L (ref 19–32)
Calcium: 9 mg/dL (ref 8.4–10.5)
Chloride: 105 mEq/L (ref 96–112)
Creatinine, Ser: 0.79 mg/dL (ref 0.40–1.20)
GFR: 82.68 mL/min (ref >60.00)
Glucose, Bld: 89 mg/dL (ref 70–99)
Potassium: 4.7 mEq/L (ref 3.5–5.1)
Sodium: 138 mEq/L (ref 135–145)
Total Bilirubin: 0.4 mg/dL (ref 0.2–1.2)
Total Protein: 6.6 g/dL (ref 6.0–8.3)

## 2019-04-15 LAB — LIPID PANEL
Cholesterol: 155 mg/dL (ref 0–200)
HDL: 53.8 mg/dL (ref >39.00)
LDL Cholesterol: 86 mg/dL (ref 0–99)
NonHDL: 100.71
Total CHOL/HDL Ratio: 3
Triglycerides: 76 mg/dL (ref 0.0–149.0)
VLDL: 15.2 mg/dL (ref 0.0–40.0)

## 2019-04-15 LAB — CBC
HCT: 40.8 % (ref 36.0–46.0)
Hemoglobin: 13.2 g/dL (ref 12.0–15.0)
MCHC: 32.5 g/dL (ref 30.0–36.0)
MCV: 83.3 fl (ref 78.0–100.0)
Platelets: 345 10*3/uL (ref 150.0–400.0)
RBC: 4.89 Mil/uL (ref 3.87–5.11)
RDW: 12.5 % (ref 11.5–15.5)
WBC: 8.1 10*3/uL (ref 4.0–10.5)

## 2019-04-15 LAB — TSH: TSH: 1.6 u[IU]/mL (ref 0.35–4.50)

## 2019-04-15 LAB — VITAMIN D 25 HYDROXY (VIT D DEFICIENCY, FRACTURES): VITD: 43.29 ng/mL (ref 30.00–100.00)

## 2019-05-05 MED FILL — NORETHIND-ETH ESTRAD 1-0.02: 1-20 | 84 days supply | Qty: 84 | Fill #1

## 2019-08-21 MED FILL — NORETHIND-ETH ESTRAD 1-0.02: 1-20 | 84 days supply | Qty: 84 | Fill #2

## 2019-10-13 ENCOUNTER — Telehealth: Payer: Self-pay | Admitting: *Deleted

## 2019-10-13 MED ORDER — FLUCONAZOLE 150 MG PO TABS: ORAL_TABLET | ORAL | 1 refills | Status: DC

## 2019-10-13 MED FILL — FLUCONAZOLE 150 MG TABS: 150 | 2 days supply | Qty: 2 | Fill #0

## 2019-10-13 NOTE — Telephone Encounter (Signed)
erroneous

## 2019-10-13 NOTE — Telephone Encounter (Signed)
Pt called stating that she was having vaginal itching with.  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  RX sent to [facility]

## 2019-11-20 ENCOUNTER — Other Ambulatory Visit: Payer: Self-pay | Admitting: Obstetrics & Gynecology

## 2019-11-20 DIAGNOSIS — Z3041 Encounter for surveillance of contraceptive pills: Secondary | ICD-10-CM

## 2019-11-23 MED FILL — NORETHIND-ETH ESTRAD 1-0.02: 1-20 | 84 days supply | Qty: 84 | Fill #0

## 2020-01-05 DIAGNOSIS — D485 Neoplasm of uncertain behavior of skin: Secondary | ICD-10-CM | POA: Diagnosis not present

## 2020-01-05 DIAGNOSIS — D2261 Melanocytic nevi of right upper limb, including shoulder: Secondary | ICD-10-CM | POA: Diagnosis not present

## 2020-01-05 DIAGNOSIS — L578 Other skin changes due to chronic exposure to nonionizing radiation: Secondary | ICD-10-CM | POA: Diagnosis not present

## 2020-01-05 DIAGNOSIS — D2262 Melanocytic nevi of left upper limb, including shoulder: Secondary | ICD-10-CM | POA: Diagnosis not present

## 2020-01-05 DIAGNOSIS — L570 Actinic keratosis: Secondary | ICD-10-CM | POA: Diagnosis not present

## 2020-01-13 DIAGNOSIS — R238 Other skin changes: Secondary | ICD-10-CM | POA: Diagnosis not present

## 2020-01-13 DIAGNOSIS — L989 Disorder of the skin and subcutaneous tissue, unspecified: Secondary | ICD-10-CM | POA: Diagnosis not present

## 2020-01-29 ENCOUNTER — Encounter: Payer: Self-pay | Admitting: Family Medicine

## 2020-01-29 ENCOUNTER — Other Ambulatory Visit: Payer: Self-pay

## 2020-01-29 ENCOUNTER — Ambulatory Visit (INDEPENDENT_AMBULATORY_CARE_PROVIDER_SITE_OTHER): Payer: 59 | Admitting: Family Medicine

## 2020-01-29 DIAGNOSIS — R109 Unspecified abdominal pain: Secondary | ICD-10-CM

## 2020-01-29 MED ORDER — HYDROCODONE-ACETAMINOPHEN 5-325 MG PO TABS: 0.5000 | ORAL_TABLET | Freq: Four times a day (QID) | ORAL | 0 refills | Status: DC | PRN

## 2020-01-29 MED ORDER — TAMSULOSIN HCL 0.4 MG PO CAPS: 0.4000 mg | ORAL_CAPSULE | Freq: Every day | ORAL | 0 refills | Status: AC

## 2020-01-29 MED ORDER — ONDANSETRON 4 MG PO TBDP: 4.0000 mg | ORAL_TABLET | Freq: Three times a day (TID) | ORAL | 0 refills | Status: DC | PRN

## 2020-01-29 MED FILL — HYDROCODON-APAP 5-325: 5-325 | 3 days supply | Qty: 10 | Fill #0

## 2020-01-29 MED FILL — ONDANSETRON ODT 4MG TBDP: 4 | 6 days supply | Qty: 20 | Fill #0

## 2020-01-29 MED FILL — TAMSULOSIN HCL 0.4 MG CAP: 0.4 | 14 days supply | Qty: 14 | Fill #0

## 2020-01-29 NOTE — Telephone Encounter (Signed)
Spoke with Dr. Nani Ravens and he okayed doxy at [phone].  Patient notified.

## 2020-01-29 NOTE — Progress Notes (Signed)
Musculoskeletal Exam  Patient: Kimberly Orr DOB: 1986/05/20  DOS: 01/29/2020  SUBJECTIVE:  Chief Complaint:   Chief Complaint  Patient presents with  . Flank Pain    complains of right flank pain since monday, getting worst   . Hematuria    Patient reported trace of blood on a home urine test    Kimberly Orr is a 34 y.o.  female for evaluation and treatment of flank pain. Due to COVID-19 pandemic, we are interacting via web portal for an electronic face-to-face visit. I verified patient's ID using 2 identifiers. Patient agreed to proceed with visit via this method. Patient is at home, I am at office. Patient and I are present for visit.   Onset:  4 days ago. No inj or change in activity.  Location: Just below rib cage on L Character:  aching, sharp and stabbing  Progression of issue:  has worsened Associated symptoms: Dipped urine in her clinic, showed trace blood Treatment: to date has been ice, OTC NSAIDS and acetaminophen.   Difficult to find comfy spot.  Has never had kidney stone, no famhx.  No fevers, discharge, urinary complaints.   Past Medical History:  Diagnosis Date  . History of chicken pox     Objective: No conversational dyspnea Age appropriate judgment and insight Nml affect and mood  Assessment:  Flank pain - Plan: ondansetron (ZOFRAN-ODT) 4 MG disintegrating tablet, HYDROcodone-acetaminophen (NORCO) 5-325 MG tablet, tamsulosin (FLOMAX) 0.4 MG CAPS capsule  Plan: Given blood in urine +worsening pain, will tx empirically for kidney stone. If no improvement by next week, would like to see her in person. Warnings about hydrocodone verbalized.  F/u prn. The patient voiced understanding and agreement to the plan.   [place], DO 01/30/20  9:26 AM

## 2020-01-29 NOTE — Telephone Encounter (Signed)
I sent her a message back asking have she used the E-Visit through [employer].

## 2020-02-01 DIAGNOSIS — M25531 Pain in right wrist: Secondary | ICD-10-CM | POA: Diagnosis not present

## 2020-02-15 ENCOUNTER — Other Ambulatory Visit: Payer: Self-pay | Admitting: *Deleted

## 2020-02-15 MED ORDER — FLUCONAZOLE 150 MG PO TABS: ORAL_TABLET | ORAL | 0 refills | Status: DC

## 2020-02-15 MED FILL — FLUCONAZOLE 150 MG TABS: 150 | 2 days supply | Qty: 2 | Fill #1

## 2020-02-15 NOTE — Progress Notes (Signed)
Pt called requesting a RX for Diflucan as she was having vaginal thick, white, itching D/C.  Per VO from Dr Gala Romney she may have the RX.  RX sent to [facility]

## 2020-03-24 DIAGNOSIS — C44311 Basal cell carcinoma of skin of nose: Secondary | ICD-10-CM | POA: Diagnosis not present

## 2020-04-05 ENCOUNTER — Encounter: Payer: Self-pay | Admitting: Family Medicine

## 2020-04-05 ENCOUNTER — Ambulatory Visit (INDEPENDENT_AMBULATORY_CARE_PROVIDER_SITE_OTHER): Payer: 59 | Admitting: Family Medicine

## 2020-04-05 ENCOUNTER — Other Ambulatory Visit: Payer: Self-pay

## 2020-04-05 VITALS — BP 112/64 | HR 79 | Temp 95.0°F | Ht 65.5 in | Wt 195.4 lb

## 2020-04-05 DIAGNOSIS — Z6832 Body mass index (BMI) 32.0-32.9, adult: Secondary | ICD-10-CM

## 2020-04-05 DIAGNOSIS — E669 Obesity, unspecified: Secondary | ICD-10-CM | POA: Diagnosis not present

## 2020-04-05 DIAGNOSIS — Z1159 Encounter for screening for other viral diseases: Secondary | ICD-10-CM | POA: Diagnosis not present

## 2020-04-05 DIAGNOSIS — Z0000 Encounter for general adult medical examination without abnormal findings: Secondary | ICD-10-CM | POA: Diagnosis not present

## 2020-04-05 NOTE — Progress Notes (Signed)
Chief Complaint  Patient presents with  . Annual Exam     Well Woman Kimberly Orr is here for a complete physical.   Her last physical was last year.   Current diet: in general, a "healthy" diet. Current exercise: HIIT Has gained some weight.  Seatbelt? Yes  Health Maintenance Pap/HPV- Yes Tetanus- Yes HIV screening- Yes  Past Medical History:  Diagnosis Date  . History of chicken pox      Past Surgical History:  Procedure Laterality Date  . DILATION AND CURETTAGE OF UTERUS  2011  . WISDOM TOOTH EXTRACTION      Medications  Current Outpatient Medications on File Prior to Visit  Medication Sig Dispense Refill  . Multiple Vitamins-Minerals (WOMENS MULTIVITAMIN PO)       Allergies No Known Allergies  Review of Systems: Constitutional:  no unexpected weight changes Eye:  no recent significant change in vision Ear/Nose/Mouth/Throat:  Ears:  no tinnitus or vertigo and no recent change in hearing Nose/Mouth/Throat:  no complaints of nasal congestion, no sore throat Cardiovascular: no chest pain Respiratory:  no cough and no shortness of breath Gastrointestinal:  no abdominal pain, no change in bowel habits GU:  Female: negative for dysuria or pelvic pain Musculoskeletal/Extremities:  no pain of the joints Integumentary (Skin/Breast):  +hair thinning over past 6 mo; otherwise no abnormal skin lesions reported Neurologic:  no headaches Endocrine:  denies fatigue Hematologic/Lymphatic:  No areas of easy bleeding  Exam BP 112/64 (BP Location: Left Arm, Patient Position: Sitting, Cuff Size: Normal)   Pulse 79   Temp (!) 95 F (35 C) (Temporal)   Ht 5' 5.5" (1.664 m)   Wt 195 lb 6 oz (88.6 kg)   SpO2 99%   BMI 32.02 kg/m  General:  well developed, well nourished, in no apparent distress Skin:  no significant moles, warts, or growths Head:  no masses, lesions, or tenderness Eyes:  pupils equal and round, sclera anicteric without injection Ears:  canals  without lesions, TMs shiny without retraction, no obvious effusion, no erythema Nose:  nares patent, septum midline, mucosa normal, and no drainage or sinus tenderness Throat/Pharynx:  lips and gingiva without lesion; tongue and uvula midline; non-inflamed pharynx; no exudates or postnasal drainage Neck: neck supple without adenopathy, thyromegaly, or masses Lungs:  clear to auscultation, breath sounds equal bilaterally, no respiratory distress Cardio:  regular rate and rhythm, no bruits, no LE edema Abdomen:  abdomen soft, nontender; bowel sounds normal; no masses or organomegaly Genital: Defer to GYN Musculoskeletal:  symmetrical muscle groups noted without atrophy or deformity Extremities:  no clubbing, cyanosis, or edema, no deformities, no skin discoloration Neuro:  gait normal; deep tendon reflexes normal and symmetric Psych: well oriented with normal range of affect and appropriate judgment/insight  Assessment and Plan  Well adult exam - Plan: CBC, Comprehensive metabolic panel, Lipid panel, TSH, IBC + Ferritin, VITAMIN D 25 Hydroxy (Vit-D Deficiency, Fractures)  Class 1 obesity without serious comorbidity with body mass index (BMI) of 32.0 to 32.9 in adult, unspecified obesity type - Plan: Amb Ref to Medical Weight Management  Encounter for hepatitis C screening test for low risk patient - Plan: Hepatitis C antibody   Well 34 y.o. female. Counseled on diet and exercise. Other orders as above. CK labs. If nml, will f/u with Derm.  Follow up for labs at convenience and CPE in 1 yr. The patient voiced understanding and agreement to the plan.  [place], DO 04/05/20 1:17 PM

## 2020-04-05 NOTE — Patient Instructions (Addendum)
Give [place] 2-3 business days to get the results of your labs back.   Keep the diet clean and stay active. If labs are normal, I would recommend you see your dermatologist.   Consider Vit B10.   If you do not hear anything about your referral in the next 1-2 weeks, call our office and ask for an update.  Let us know if you need anything.

## 2020-04-15 ENCOUNTER — Other Ambulatory Visit (INDEPENDENT_AMBULATORY_CARE_PROVIDER_SITE_OTHER): Payer: 59

## 2020-04-15 ENCOUNTER — Other Ambulatory Visit: Payer: Self-pay

## 2020-04-15 DIAGNOSIS — Z1159 Encounter for screening for other viral diseases: Secondary | ICD-10-CM | POA: Diagnosis not present

## 2020-04-15 DIAGNOSIS — Z0000 Encounter for general adult medical examination without abnormal findings: Secondary | ICD-10-CM | POA: Diagnosis not present

## 2020-04-15 LAB — TSH: TSH: 2.39 u[IU]/mL (ref 0.35–4.50)

## 2020-04-15 LAB — COMPREHENSIVE METABOLIC PANEL
ALT: 12 U/L (ref 0–35)
AST: 13 U/L (ref 0–37)
Albumin: 4.4 g/dL (ref 3.5–5.2)
Alkaline Phosphatase: 83 U/L (ref 39–117)
BUN: 10 mg/dL (ref 6–23)
CO2: 22 mEq/L (ref 19–32)
Calcium: 9.3 mg/dL (ref 8.4–10.5)
Chloride: 107 mEq/L (ref 96–112)
Creatinine, Ser: 0.87 mg/dL (ref 0.40–1.20)
GFR: 73.55 mL/min (ref >60.00)
Glucose, Bld: 90 mg/dL (ref 70–99)
Potassium: 4.1 mEq/L (ref 3.5–5.1)
Sodium: 142 mEq/L (ref 135–145)
Total Bilirubin: 0.5 mg/dL (ref 0.2–1.2)
Total Protein: 6.4 g/dL (ref 6.0–8.3)

## 2020-04-15 LAB — LIPID PANEL
Cholesterol: 143 mg/dL (ref 0–200)
HDL: 53.9 mg/dL (ref >39.00)
LDL Cholesterol: 78 mg/dL (ref 0–99)
NonHDL: 88.77
Total CHOL/HDL Ratio: 3
Triglycerides: 53 mg/dL (ref 0.0–149.0)
VLDL: 10.6 mg/dL (ref 0.0–40.0)

## 2020-04-15 LAB — CBC
HCT: 41.9 % (ref 36.0–46.0)
Hemoglobin: 13.6 g/dL (ref 12.0–15.0)
MCHC: 32.5 g/dL (ref 30.0–36.0)
MCV: 83.5 fl (ref 78.0–100.0)
Platelets: 271 10*3/uL (ref 150.0–400.0)
RBC: 5.02 Mil/uL (ref 3.87–5.11)
RDW: 13.2 % (ref 11.5–15.5)
WBC: 8.4 10*3/uL (ref 4.0–10.5)

## 2020-04-15 LAB — IBC + FERRITIN
Ferritin: 12.3 ng/mL (ref 10.0–291.0)
Iron: 58 ug/dL (ref 42–145)
Saturation Ratios: 12.6 % — ABNORMAL LOW (ref 20.0–50.0)
Transferrin: 328 mg/dL (ref 212.0–360.0)

## 2020-04-15 LAB — VITAMIN D 25 HYDROXY (VIT D DEFICIENCY, FRACTURES): VITD: 33.2 ng/mL (ref 30.00–100.00)

## 2020-04-18 LAB — HEPATITIS C ANTIBODY
Hepatitis C Ab: NONREACTIVE
SIGNAL TO CUT-OFF: 0 (ref <1.00)

## 2020-04-20 ENCOUNTER — Other Ambulatory Visit: Payer: Self-pay

## 2020-04-20 ENCOUNTER — Encounter (INDEPENDENT_AMBULATORY_CARE_PROVIDER_SITE_OTHER): Payer: Self-pay | Admitting: Family Medicine

## 2020-04-20 ENCOUNTER — Ambulatory Visit (INDEPENDENT_AMBULATORY_CARE_PROVIDER_SITE_OTHER): Payer: 59 | Admitting: Family Medicine

## 2020-04-20 VITALS — BP 118/78 | HR 66 | Temp 98.0°F | Ht 65.0 in | Wt 191.0 lb

## 2020-04-20 DIAGNOSIS — E559 Vitamin D deficiency, unspecified: Secondary | ICD-10-CM | POA: Diagnosis not present

## 2020-04-20 DIAGNOSIS — F3289 Other specified depressive episodes: Secondary | ICD-10-CM

## 2020-04-20 DIAGNOSIS — R5383 Other fatigue: Secondary | ICD-10-CM | POA: Diagnosis not present

## 2020-04-20 DIAGNOSIS — Z862 Personal history of diseases of the blood and blood-forming organs and certain disorders involving the immune mechanism: Secondary | ICD-10-CM | POA: Diagnosis not present

## 2020-04-20 DIAGNOSIS — R0602 Shortness of breath: Secondary | ICD-10-CM

## 2020-04-20 DIAGNOSIS — Z9189 Other specified personal risk factors, not elsewhere classified: Secondary | ICD-10-CM

## 2020-04-20 DIAGNOSIS — Z6831 Body mass index (BMI) 31.0-31.9, adult: Secondary | ICD-10-CM

## 2020-04-20 DIAGNOSIS — Z0289 Encounter for other administrative examinations: Secondary | ICD-10-CM

## 2020-04-20 DIAGNOSIS — C44311 Basal cell carcinoma of skin of nose: Secondary | ICD-10-CM

## 2020-04-20 DIAGNOSIS — E669 Obesity, unspecified: Secondary | ICD-10-CM | POA: Diagnosis not present

## 2020-04-20 MED ORDER — VITAMIN D (ERGOCALCIFEROL) 1.25 MG (50000 UNIT) PO CAPS: 50000.0000 [IU] | ORAL_CAPSULE | ORAL | 0 refills | Status: DC

## 2020-04-20 MED FILL — VIT D2 1.25 MG (50,000 UNIT: 1.25 MG | 28 days supply | Qty: 2 | Fill #0

## 2020-04-20 NOTE — Progress Notes (Signed)
Dear Dr. Nani Ravens,   Thank you kindly for referring Kimberly Orr to our clinic. The following note includes my evaluation and treatment recommendations.   Chief Complaint:   Kimberly Orr (MR# [id number]) is a 34 y.o. female who presents for evaluation and treatment of obesity and related comorbidities. Current BMI is Body mass index is 31.78 kg/m. Kimberly Orr has been struggling with her weight for many years and has been unsuccessful in either losing weight, maintaining weight loss, or reaching her healthy weight goal.  Kimberly Orr is currently in the action stage of change and ready to dedicate time achieving and maintaining a healthier weight. Kimberly Orr is interested in becoming our patient and working on intensive lifestyle modifications including (but not limited to) diet and exercise for weight loss.  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  She is married with 3 children, ages 50-78 years old.  They all are good eaters, not picky.  They have a garden.  Kimberly Orr tends to have sweet tea and 1-2 cans of regular Dr. Malachi Orr during the day.  She dislikes yogurt.  She says she sometimes skips breakfast.  She craves sweets and carbs, especially in the afternoon and at night.  Kimberly Orr's habits were reviewed today and are as follows: Her family eats meals together, she thinks her family will eat healthier with her, she struggles with family and or coworkers weight loss sabotage, her desired weight loss is 40 pounds, she started gaining weight within the last 3 years, her heaviest weight ever was her current weight of 195 pounds, she craves sweets and carbs, she snacks frequently in the evenings, she skips breakfast occasionally, she is frequently drinking liquids with calories, she frequently makes poor food choices, she frequently eats larger portions than normal and she struggles with emotional eating.  Depression Screen Kimberly Orr's Food and Mood (modified PHQ-9) score was 11.  Depression screen PHQ  2/9 04/20/2020  Decreased Interest 1  Down, Depressed, Hopeless 2  PHQ - 2 Score 3  Altered sleeping 1  Tired, decreased energy 2  Change in appetite 2  Feeling bad or failure about yourself  1  Trouble concentrating 1  Moving slowly or fidgety/restless 1  Suicidal thoughts 0  PHQ-9 Score 11  Difficult doing work/chores Somewhat difficult   Subjective:   1. Other fatigue Keren admits to daytime somnolence and reports waking up still tired. Patent has a history of symptoms of daytime fatigue and morning fatigue. Kimberly Orr generally gets 7 or 8 hours of sleep per night, and states that she has generally restful sleep. Snoring is not present. Apneic episodes are not present. Epworth Sleepiness Score is 7.  2. Shortness of breath on exertion Kimberly Orr notes increasing shortness of breath with exercising and seems to be worsening over time with weight gain. She notes getting out of breath sooner with activity than she used to. This has gotten worse recently. Kimberly Orr denies shortness of breath at rest or orthopnea.  3. H/O iron deficiency anemia Kimberly Orr is not a vegetarian.  She does not have a history of weight loss surgery.   CBC Latest Ref Rng & Units 04/15/2020 04/15/2019 02/03/2018  WBC 4.0 - 10.5 K/uL 8.4 8.1 9.5  Hemoglobin 12.0 - 15.0 g/dL 13.6 13.2 13.6  Hematocrit 36 - 46 % 41.9 40.8 41.2  Platelets 150 - 400 K/uL 271.0 345.0 313.0   Lab Results  Component Value Date   IRON 58 04/15/2020   FERRITIN 12.3 04/15/2020   4. Basal cell carcinoma (  BCC) of skin of nose [name] denies concerns or new skin concerns currently.  She follows with her dermatologist regularly, per patient, [facility].  5. Vitamin D deficiency Kimberly Orr's Vitamin D level was 33.20 on 04/15/2020. She is currently taking no vitamin D supplement.  She endorses fatigue and decreased energy levels, especially lately.  6. Other depression, with emotional eating Kimberly Orr is struggling with  emotional eating and using food for comfort to the extent that it is negatively impacting her health. She has been working on behavior modification techniques to help reduce her emotional eating and has been unsuccessful. She shows no sign of suicidal or homicidal ideations.  She endorses being under a lot of stress.  PHQ-9 is 11.  7. At risk for dehydration Kimberly Orr is at risk for dehydration.  I had a discussion with the patient that with exercise and the heat, she should shoot for near half her weight in ounces of water per day.  Assessment/Plan:   1. Other fatigue Kimberly Orr does feel that her weight is causing her energy to be lower than it should be. Fatigue may be related to obesity, depression or many other causes. Labs will be ordered, and in the meanwhile, Kimberly Orr will focus on self care including making healthy food choices, increasing physical activity and focusing on stress reduction. - EKG 12-Lead - Hemoglobin A1c - Insulin, random - Vitamin B12 - Folate - T3, free - T4, free - TSH  2. Shortness of breath on exertion Kimberly Orr does feel that she gets out of breath more easily that she used to when she exercises. Kimberly Orr's shortness of breath appears to be obesity related and exercise induced. She has agreed to work on weight loss and gradually increase exercise to treat her exercise induced shortness of breath. Will continue to monitor closely.  3. H/O iron deficiency anemia Will check labs.  Continue prudent nutritional plan and will monitor.  Orders and follow up as documented in patient record.  Counseling . Iron is essential for our bodies to make red blood cells.  Reasons that someone may be deficient include: an iron-deficient diet (more likely in those following vegan or vegetarian diets), women with heavy menses, patients with GI disorders or poor absorption, patients that have had bariatric surgery, frequent blood donors, patients with cancer, and patients with heart  disease.   Kimberly Orr Noble foods include dark leafy greens, red and white meats, eggs, seafood, and beans.   . Certain foods and drinks prevent your body from absorbing iron properly. Avoid eating these foods in the same meal as iron-rich foods or with iron supplements. These foods include: coffee, black tea, and red wine; milk, dairy products, and foods that are high in calcium; beans and soybeans; whole grains.  . Constipation can be a side effect of iron supplementation. Increased water and fiber intake are helpful. Water goal: > 2 liters/day. Fiber goal: > 25 grams/day.  4. Basal cell carcinoma (BCC) of skin of nose Continue regular follow-up with Dermatology for skin screening every 6 months per them.  5. Vitamin D deficiency Low Vitamin D level contributes to fatigue and are associated with obesity, breast, and colon cancer. She agrees to start to take prescription Vitamin D @50 ,000 IU every 14 days.  Will recheck labs in 3 months.  6. Other depression, with emotional eating Patient was referred to Dr. Mallie Mussel, our Bariatric Psychologist, for evaluation due to her elevated PHQ-9 score of 11, and significant struggles with emotional eating.  Will check labs.  Continue prudent nutritional plan and weight loss.  7. At risk for dehydration Kerrilyn was given approximately 15 minutes dehydration prevention counseling today. Penina is at risk for dehydration due to weight loss and current medication(s). She was encouraged to hydrate and monitor fluid status to avoid dehydration as well as weight loss plateaus.   8. Class 1 obesity with serious comorbidity and body mass index (BMI) of 31.0 to 31.9 in adult, unspecified obesity type Byrdie is currently in the action stage of change and her goal is to continue with weight loss efforts. I recommend Adriyana begin the structured treatment plan as follows:  She has agreed to the Category 2 Plan.  Exercise goals: As is.   Behavioral modification  strategies: increasing lean protein intake and planning for success.  She was informed of the importance of frequent follow-up visits to maximize her success with intensive lifestyle modifications for her multiple health conditions. She was informed we would discuss her lab results at her next visit unless there is a critical issue that needs to be addressed sooner. Mariam agreed to keep her next visit at the agreed upon time to discuss these results.  Objective:   Blood pressure 118/78, pulse 66, temperature 98 F (36.7 C), temperature source Oral, height 5\' 5"  (1.651 m), weight 191 lb (86.6 kg), last menstrual period 04/12/2020, SpO2 99 %. Body mass index is 31.78 kg/m.  EKG: Normal sinus rhythm, rate 63 bpm.  Indirect Calorimeter completed today shows a VO2 of 273 and a REE of 1901.  Her calculated basal metabolic rate is 2878 thus her basal metabolic rate is better than expected.  General: Cooperative, alert, well developed, in no acute distress. HEENT: Conjunctivae and lids unremarkable. Cardiovascular: Regular rhythm.  Lungs: Normal work of breathing. Neurologic: No focal deficits.   Lab Results  Component Value Date   CREATININE 0.87 04/15/2020   BUN 10 04/15/2020   NA 142 04/15/2020   K 4.1 04/15/2020   CL 107 04/15/2020   CO2 22 04/15/2020   Lab Results  Component Value Date   ALT 12 04/15/2020   AST 13 04/15/2020   ALKPHOS 83 04/15/2020   BILITOT 0.5 04/15/2020   Lab Results  Component Value Date   TSH 2.39 04/15/2020   Lab Results  Component Value Date   CHOL 143 04/15/2020   HDL 53.90 04/15/2020   LDLCALC 78 04/15/2020   TRIG 53.0 04/15/2020   CHOLHDL 3 04/15/2020   Lab Results  Component Value Date   WBC 8.4 04/15/2020   HGB 13.6 04/15/2020   HCT 41.9 04/15/2020   MCV 83.5 04/15/2020   PLT 271.0 04/15/2020   Lab Results  Component Value Date   IRON 58 04/15/2020   FERRITIN 12.3 04/15/2020   Attestation Statements:   Reviewed by clinician  on day of visit: allergies, medications, problem list, medical history, surgical history, family history, social history, and previous encounter notes.  I, Water quality scientist, CMA, am acting as Location manager for [employer], DO.  I have reviewed the above documentation for accuracy and completeness, and I agree with the above. Mellody Dance, DO

## 2020-04-21 LAB — T4, FREE: Free T4: 1.32 ng/dL (ref 0.82–1.77)

## 2020-04-21 LAB — VITAMIN B12: Vitamin B-12: 428 pg/mL (ref 232–1245)

## 2020-04-21 LAB — FOLATE: Folate: 6 ng/mL (ref >3.0)

## 2020-04-21 LAB — T3, FREE: T3, Free: 3.7 pg/mL (ref 2.0–4.4)

## 2020-04-21 LAB — INSULIN, RANDOM: INSULIN: 14.4 u[IU]/mL (ref 2.6–24.9)

## 2020-04-21 LAB — TSH: TSH: 2.1 u[IU]/mL (ref 0.450–4.500)

## 2020-04-21 LAB — HEMOGLOBIN A1C
Est. average glucose Bld gHb Est-mCnc: 111 mg/dL
Hgb A1c MFr Bld: 5.5 % (ref 4.8–5.6)

## 2020-05-04 ENCOUNTER — Other Ambulatory Visit: Payer: Self-pay

## 2020-05-04 ENCOUNTER — Ambulatory Visit (INDEPENDENT_AMBULATORY_CARE_PROVIDER_SITE_OTHER): Payer: 59 | Admitting: Family Medicine

## 2020-05-04 ENCOUNTER — Encounter (INDEPENDENT_AMBULATORY_CARE_PROVIDER_SITE_OTHER): Payer: Self-pay | Admitting: Family Medicine

## 2020-05-04 VITALS — BP 108/74 | HR 60 | Temp 97.7°F | Ht 65.0 in | Wt 191.0 lb

## 2020-05-04 DIAGNOSIS — F3289 Other specified depressive episodes: Secondary | ICD-10-CM

## 2020-05-04 DIAGNOSIS — E559 Vitamin D deficiency, unspecified: Secondary | ICD-10-CM

## 2020-05-04 DIAGNOSIS — Z9189 Other specified personal risk factors, not elsewhere classified: Secondary | ICD-10-CM | POA: Diagnosis not present

## 2020-05-04 DIAGNOSIS — E8881 Metabolic syndrome: Secondary | ICD-10-CM | POA: Diagnosis not present

## 2020-05-04 DIAGNOSIS — E669 Obesity, unspecified: Secondary | ICD-10-CM | POA: Diagnosis not present

## 2020-05-04 DIAGNOSIS — Z6831 Body mass index (BMI) 31.0-31.9, adult: Secondary | ICD-10-CM | POA: Diagnosis not present

## 2020-05-04 DIAGNOSIS — Z862 Personal history of diseases of the blood and blood-forming organs and certain disorders involving the immune mechanism: Secondary | ICD-10-CM

## 2020-05-04 MED ORDER — VITAMIN D (ERGOCALCIFEROL) 1.25 MG (50000 UNIT) PO CAPS: 50000.0000 [IU] | ORAL_CAPSULE | ORAL | 0 refills | Status: DC

## 2020-05-04 MED ORDER — FERROUS SULFATE 325 (65 FE) MG PO TABS: 325.0000 mg | ORAL_TABLET | Freq: Every day | ORAL | 0 refills | Status: DC

## 2020-05-04 MED FILL — FERROUS SULFATE 325 MG TAB: 325 (65 FE) | 100 days supply | Qty: 100 | Fill #0

## 2020-05-04 NOTE — Progress Notes (Signed)
Chief Complaint:   OBESITY Kimberly Orr is here to discuss her progress with her obesity treatment plan along with follow-up of her obesity related diagnoses. Kimberly Orr is on the Category 2 Plan and states she is following her eating plan approximately 75-80% of the time. Kimberly Orr states she is doing [employer] for 45 minutes 3 times per week.  Today's visit was #: 2 Starting weight: 191 lbs Starting date: 04/20/2020 Today's weight: 191 lbs Today's date: 05/04/2020 Total lbs lost to date: 0 Total lbs lost since last in-office visit: 0  Interim History: Kimberly Orr was at [facility] with family for a week over the past 2 weeks.  She has been journaling in [facility] in addition to Category 2.  She has her menses coming up in 7-10 days additionally, and typically gains 3-4 pounds, at least, during that time.  Subjective:   1. Insulin resistance New.  Discussed labs with patient today.  Kimberly Orr has a diagnosis of insulin resistance based on her elevated fasting insulin level >5. She continues to work on diet and exercise to decrease her risk of diabetes.    Lab Results  Component Value Date   INSULIN 14.4 04/20/2020   Lab Results  Component Value Date   HGBA1C 5.5 04/20/2020   2. Vitamin D deficiency Discussed labs with patient today.  Kimberly Orr's Vitamin D level was 33.20 on 04/15/2020. She is currently taking prescription vitamin D 50,000 IU twice a week. She denies nausea, vomiting or muscle weakness.  3. H/O iron deficiency anemia Discussed labs with patient today.  Kimberly Orr is not a vegetarian.  She does not have a history of weight loss surgery.   CBC Latest Ref Rng & Units 04/15/2020 04/15/2019 02/03/2018  WBC 4.0 - 10.5 K/uL 8.4 8.1 9.5  Hemoglobin 12.0 - 15.0 g/dL 13.6 13.2 13.6  Hematocrit 36 - 46 % 41.9 40.8 41.2  Platelets 150 - 400 K/uL 271.0 345.0 313.0   Lab Results  Component Value Date   IRON 58 04/15/2020   FERRITIN 12.3 04/15/2020   Lab Results  Component Value  Date   VITAMINB12 428 04/20/2020   4. Other depression, with emotional eating Kimberly Orr has a visit with Dr. Mallie Mussel on Monday.  5. At risk for diabetes mellitus Kimberly Orr is at higher than average risk for developing diabetes due to her obesity.   Assessment/Plan:   1. Insulin resistance Kimberly Orr will continue to work on weight loss, exercise, and decreasing simple carbohydrates to help decrease the risk of diabetes. Kimberly Orr agreed to follow-up with [place] as directed to closely monitor her progress.  Continue prudent nutritional plan, weight loss.  Will consider metformin in the future as patient was on this while trying to get pregnant in the past.  2. Vitamin D deficiency Low Vitamin D level contributes to fatigue and are associated with obesity, breast, and colon cancer. She agrees to continue to take prescription Vitamin D @50 ,000 IU every week and will follow-up for routine testing of Vitamin D, at least 2-3 times per year to avoid over-replacement.  Will change vitamin D to weekly dosing instead of twice weekly dosing. - Vitamin D, Ergocalciferol, (DRISDOL) 1.25 MG (50000 UNIT) CAPS capsule; Take 1 capsule (50,000 Units total) by mouth every 7 (seven) days.  Dispense: 4 capsule; Refill: 0  3. H/O iron deficiency anemia Start ferrous sulfate 325 mg daily.  She was told that she can take along with vitamin C.  Recheck levels in 3-4 months.  Orders and follow up  as documented in patient record.  Counseling . Iron is essential for our bodies to make red blood cells.  Reasons that someone may be deficient include: an iron-deficient diet (more likely in those following vegan or vegetarian diets), women with heavy menses, patients with GI disorders or poor absorption, patients that have had bariatric surgery, frequent blood donors, patients with cancer, and patients with heart disease.   Kimberly Orr foods include dark leafy greens, red and white meats, eggs, seafood, and beans.   . Certain foods  and drinks prevent your body from absorbing iron properly. Avoid eating these foods in the same meal as iron-rich foods or with iron supplements. These foods include: coffee, black tea, and red wine; milk, dairy products, and foods that are high in calcium; beans and soybeans; whole grains.  . Constipation can be a side effect of iron supplementation. Increased water and fiber intake are helpful. Water goal: > 2 liters/day. Fiber goal: > 25 grams/day. - ferrous sulfate 325 (65 FE) MG tablet; Take 1 tablet (325 mg total) by mouth daily with breakfast.  Dispense: 30 tablet; Refill: 0  4. Other depression, with emotional eating Behavior modification techniques were discussed today to help Kimberly Orr deal with her emotional/non-hunger eating behaviors.  Orders and follow up as documented in patient record.   5. At risk for diabetes mellitus Kimberly Orr was given approximately 15 minutes of diabetes education and counseling today. We discussed intensive lifestyle modifications today with an emphasis on weight loss as well as increasing exercise and decreasing simple carbohydrates in her diet. We also reviewed medication options with an emphasis on risk versus benefit of those discussed.   Repetitive spaced learning was employed today to elicit superior memory formation and behavioral change.  6. Class 1 obesity with serious comorbidity and body mass index (BMI) of 31.0 to 31.9 in adult, unspecified obesity type Kimberly Orr is currently in the action stage of change. As such, her goal is to continue with weight loss efforts. She has agreed to the Category 2 Plan.   Exercise goals: As is.  Behavioral modification strategies: increasing lean protein intake, increasing water intake and planning for success.  Kimberly Orr has agreed to follow-up with our clinic in 2 weeks. She was informed of the importance of frequent follow-up visits to maximize her success with intensive lifestyle modifications for her multiple  health conditions.   Objective:   Blood pressure 108/74, pulse 60, temperature 97.7 F (36.5 C), temperature source Oral, height 5\' 5"  (1.651 m), weight 191 lb (86.6 kg), last menstrual period 04/12/2020, SpO2 97 %. Body mass index is 31.78 kg/m.  General: Cooperative, alert, well developed, in no acute distress. HEENT: Conjunctivae and lids unremarkable. Cardiovascular: Regular rhythm.  Lungs: Normal work of breathing. Neurologic: No focal deficits.   Lab Results  Component Value Date   CREATININE 0.87 04/15/2020   BUN 10 04/15/2020   NA 142 04/15/2020   K 4.1 04/15/2020   CL 107 04/15/2020   CO2 22 04/15/2020   Lab Results  Component Value Date   ALT 12 04/15/2020   AST 13 04/15/2020   ALKPHOS 83 04/15/2020   BILITOT 0.5 04/15/2020   Lab Results  Component Value Date   HGBA1C 5.5 04/20/2020   Lab Results  Component Value Date   INSULIN 14.4 04/20/2020   Lab Results  Component Value Date   TSH 2.100 04/20/2020   Lab Results  Component Value Date   CHOL 143 04/15/2020   HDL 53.90 04/15/2020   LDLCALC  78 04/15/2020   TRIG 53.0 04/15/2020   CHOLHDL 3 04/15/2020   Lab Results  Component Value Date   WBC 8.4 04/15/2020   HGB 13.6 04/15/2020   HCT 41.9 04/15/2020   MCV 83.5 04/15/2020   PLT 271.0 04/15/2020   Lab Results  Component Value Date   IRON 58 04/15/2020   FERRITIN 12.3 04/15/2020   Attestation Statements:   Reviewed by clinician on day of visit: allergies, medications, problem list, medical history, surgical history, family history, social history, and previous encounter notes.  I, Water quality scientist, CMA, am acting as Location manager for [employer], DO.  I have reviewed the above documentation for accuracy and completeness, and I agree with the above. Mellody Dance, DO

## 2020-05-09 ENCOUNTER — Other Ambulatory Visit: Payer: Self-pay

## 2020-05-09 ENCOUNTER — Telehealth (INDEPENDENT_AMBULATORY_CARE_PROVIDER_SITE_OTHER): Payer: 59 | Admitting: Psychology

## 2020-05-09 DIAGNOSIS — F5089 Other specified eating disorder: Secondary | ICD-10-CM | POA: Diagnosis not present

## 2020-05-09 NOTE — Progress Notes (Signed)
Office: [phone]  /  Fax: [phone]    Date: May 09, 2020   Appointment Start Time: 3:00pm Duration: 31 minutes Provider: Glennie Isle, Psy.D. Type of Session: Intake for Individual Therapy  Location of Patient: Work Location of Provider: [facility] Type of Contact: Telepsychological Visit via MyChart Video Visit  Informed Consent: Prior to proceeding with today's appointment, two pieces of identifying information were obtained. In addition, Belenda's physical location at the time of this appointment was obtained as well a phone number she could be reached at in the event of technical difficulties. Sunya and this provider participated in today's telepsychological service.   The provider's role was explained to [employer] . The provider reviewed and discussed issues of confidentiality, privacy, and limits therein (e.g., reporting obligations). In addition to verbal informed consent, written informed consent for psychological services was obtained prior to the initial appointment. Since the clinic is not a 24/7 crisis center, mental health emergency resources were shared and this  provider explained MyChart, e-mail, voicemail, and/or other messaging systems should be utilized only for non-emergency reasons. This provider also explained that information obtained during appointments will be placed in Lenee's medical record and relevant information will be shared with other providers at [facility] for coordination of care. Moreover, Annai agreed information may be shared with other [facility] providers as needed for coordination of care. By signing the service agreement document, Julius provided written consent for coordination of care. Prior to initiating telepsychological services, Harli completed an informed consent document, which included the development of a safety plan (i.e., an emergency contact, nearest emergency  room, and emergency resources) in the event of an emergency/crisis. Nayelli expressed understanding of the rationale of the safety plan. Kiyona verbally acknowledged understanding she is ultimately responsible for understanding her insurance benefits for telepsychological and in-person services. This provider also reviewed confidentiality, as it relates to telepsychological services, as well as the rationale for telepsychological services (i.e., to reduce exposure risk to COVID-19). Kamie  acknowledged understanding that appointments cannot be recorded without both party consent and she is aware she is responsible for securing confidentiality on her end of the session. Sydne verbally consented to proceed.  Chief Complaint/HPI: Christell was referred by Dr. Mellody Dance due to other depression, with emotional eating. Per the note for the initial visit with Dr. Mellody Dance on April 20, 2020, "Malani is struggling with emotional eating and using food for comfort to the extent that it is negatively impacting her health. She has been working on behavior modification techniques to help reduce her emotional eating and has been unsuccessful. She shows no sign of suicidal or homicidal ideations.  She endorses being under a lot of stress.  PHQ-9 is 11." The note for the initial appointment with Dr. Mellody Dance indicated the following: "Anquanette's habits were reviewed today and are as follows: Her family eats meals together, she thinks her family will eat healthier with her, she struggles with family and or coworkers weight loss sabotage, her desired weight loss is 40 pounds, she started gaining weight within the last 3 years, her heaviest weight ever was her current weight of 195 pounds, she craves sweets and carbs, she snacks frequently in the evenings, she skips breakfast occasionally, she is frequently drinking liquids with calories, she frequently makes poor food choices, she frequently eats larger  portions than normal and she struggles with emotional eating." Joycie's Food and Mood (modified PHQ-9) score on April 20, 2020  was 11.  During today's appointment, Eura was verbally administered a questionnaire assessing various behaviors related to emotional eating. Mckinzey endorsed the following: overeat when you are celebrating, experience food cravings on a regular basis, eat certain foods when you are anxious, stressed, depressed, or your feelings are hurt, find food is comforting to you, overeat when you are worried about something and eat as a reward. Areej is unsure about the onset of emotional eating, but noted it was exacerbated in the past year. She described the current frequency of emotional eating as "few times a month." In addition, Nicol denied a history of binge eating. Amorie denied a history of restricting food intake, purging and engagement in other compensatory strategies, and has never been diagnosed with an eating disorder. She also denied a history of treatment for emotional eating. Moreover, Janeil indicated stress recently triggers emotional eating, whereas exercising makes emotional eating better. Furthermore, Aislin denied other problems of concern.    Mental Status Examination:  Appearance: well groomed and appropriate hygiene  Behavior: appropriate to circumstances Mood: euthymic Affect: mood congruent Speech: normal in rate, volume, and tone Eye Contact: appropriate Psychomotor Activity: appropriate Gait: unable to assess Thought Process: linear, logical, and goal directed  Thought Content/Perception: denies suicidal and homicidal ideation, plan, and intent and no hallucinations, delusions, bizarre thinking or behavior reported or observed Orientation: time, person, place, and purpose of appointment Memory/Concentration: memory, attention, language, and fund of knowledge intact  Insight/Judgment: good  Family & Psychosocial History: Deyanira  reported she is married and she has three children (ages 67, 13, and 43). 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 Currently, Leina's social support system consists of her husband, parents, husband's parents, and two sisters. Moreover, Aspen stated she resides with her husband and children.   Medical History:  Past Medical History:  Diagnosis Date  . Anemia   . Basal cell carcinoma   . Depression   . GERD (gastroesophageal reflux disease)   . History of chicken pox   . Joint pain   . Weight gain    Past Surgical History:  Procedure Laterality Date  . DILATION AND CURETTAGE OF UTERUS  2011  . WISDOM TOOTH EXTRACTION     Current Outpatient Medications on File Prior to Visit  Medication Sig Dispense Refill  . ferrous sulfate 325 (65 FE) MG tablet Take 1 tablet (325 mg total) by mouth daily with breakfast. 30 tablet 0  . ibuprofen (ADVIL) 600 MG tablet Take 600 mg by mouth as needed.    . Multiple Vitamins-Minerals (WOMENS MULTIVITAMIN PO)     . Vitamin D, Ergocalciferol, (DRISDOL) 1.25 MG (50000 UNIT) CAPS capsule Take 1 capsule (50,000 Units total) by mouth every 7 (seven) days. 4 capsule 0   No current facility-administered medications on file prior to visit.   Mental Health History: Editha reported she attended therapeutic services in February 2021 when she learned her foster son was leaving the family. Marieme reported there is no history of hospitalizations for psychiatric concerns, and she has never been prescribed psychotropic medications. Heylee denied a family history of mental health related concerns. Jaydalyn reported there is no history of trauma including psychological, physical  and sexual abuse, as well as neglect.   Keri described her typical mood lately as "pretty good." She acknowledged she "get[s] anxious over things [she] can't control."  Shelda denied current alcohol use. She denied tobacco  use. She denied illicit/recreational substance use. Regarding caffeine intake, Suzanne reported consuming Dr. [name] prior to starting with the clinic. Furthermore, [name] indicated she is not experiencing the following: hallucinations and delusions, paranoia, symptoms of mania , social withdrawal, crying spells, panic attacks and decreased motivation. She also denied history of and current suicidal ideation, plan, and intent; history of and current homicidal ideation, plan, and intent; and history of and current engagement in self-harm.  The following strengths were reported by [name]: caring and easy going. The following strengths were observed by this provider: ability to express thoughts and feelings during the therapeutic session, ability to establish and benefit from a therapeutic relationship, willingness to work toward established goal(s) with the clinic and ability to engage in reciprocal conversation.   Legal History: Quiana reported there is no history of legal involvement.   Structured Assessments Results: The Patient Health Questionnaire-9 (PHQ-9) is a self-report measure that assesses symptoms and severity of depression over the course of the last two weeks. Skyanne obtained a score of 2 suggesting minimal depression. Chameka finds the endorsed symptoms to be not difficult at all. [0= Not at all; 1= Several days; 2= More than half the days; 3= Nearly every day] Little interest or pleasure in doing things 0  Feeling down, depressed, or hopeless 0  Trouble falling or staying asleep, or sleeping too much 0  Feeling tired or having little energy 1  Poor appetite or overeating 0  Feeling bad about yourself --- or that you are a failure or have let yourself or your family down 0  Trouble concentrating on things, such as reading the newspaper or watching television 1  Moving or speaking so slowly that other people could have  noticed? Or the opposite --- being so fidgety or restless that you have been moving around a lot more than usual 0  Thoughts that you would be better off dead or hurting yourself in some way 0  PHQ-9 Score 2    The Generalized Anxiety Disorder-7 (GAD-7) is a brief self-report measure that assesses symptoms of anxiety over the course of the last two weeks. Ariely obtained a score of 2 suggesting minimal anxiety. Frederick finds the endorsed symptoms to be not difficult at all. [0= Not at all; 1= Several days; 2= Over half the days; 3= Nearly every day] Feeling nervous, anxious, on edge 0  Not being able to stop or control worrying 0  Worrying too much about different things 1  Trouble relaxing 1  Being so restless that it's hard to sit still 0  Becoming easily annoyed or irritable 0  Feeling afraid as if something awful might happen 0  GAD-7 Score 2   Interventions:  Conducted a chart review Focused on rapport building Verbally administered PHQ-9 and GAD-7 for symptom monitoring Verbally administered Food & Mood questionnaire to assess various behaviors related to emotional eating Provided emphatic reflections and validation Collaborated with patient on a treatment goal  Psychoeducation provided regarding physical versus emotional hunger  Provisional DSM-5 Diagnosis(es): 307.59 (F50.8) Other Specified Feeding or Eating Disorder, Emotional Eating Behaviors  Plan: Lillymae appears able and willing to participate as evidenced by collaboration on a treatment goal, engagement in reciprocal conversation, and asking questions as needed for clarification. The next appointment will be scheduled in approximately two weeks, which will be via MyChart Video Visit. The following treatment goal was established: increase coping skills. This provider will regularly review the treatment plan and medical chart to keep informed of status changes.  Merrilyn expressed understanding and agreement with the initial  treatment plan of care. Carlina will be sent a handout via e-mail to utilize between now and the next appointment to increase awareness of hunger patterns and subsequent eating. [name] provided verbal consent during today's appointment for this provider to send the handout via e-mail.

## 2020-05-11 NOTE — Progress Notes (Signed)
  Office: [phone]  /  Fax: [phone]    Date: May 25, 2020   Appointment Start Time: 8:27am Duration: 27 minutes Provider: Glennie Isle, Psy.D. Type of Session: Individual Therapy  Location of Patient: Work Location of Provider: Provider's Home Type of Contact: Telepsychological Visit via MyChart Video Visit  Session Content: Kimberly Orr is a 34 y.o. female presenting via [place] Visit for a follow-up appointment to address the previously established treatment goal of increasing coping skills. Today's appointment was a telepsychological visit due to COVID-19. [name] provided verbal consent for today's telepsychological appointment and she is aware she is responsible for securing confidentiality on her end of the session. Prior to proceeding with today's appointment, [name]'s physical location at the time of this appointment was obtained as well a phone number she could be reached at in the event of technical difficulties. [name] and this provider participated in today's telepsychological service.   This provider conducted a brief check-in. [name] shared about a recent vacation, noting she focused on making better choices. Emotional and physical hunger were reviewed. Psychoeducation regarding triggers for emotional eating was provided. [name] was provided a handout, and encouraged to utilize the handout between now and the next appointment to increase awareness of triggers and frequency. [name] agreed. This provider also discussed behavioral strategies for specific triggers, such as placing the utensil down when conversing to avoid mindless eating. [name] provided verbal consent during today's appointment for this provider to send a handout about triggers via e-mail. [name] was receptive to today's appointment as evidenced by openness to sharing, responsiveness to feedback, and willingness to explore triggers for emotional eating.  Mental Status Examination:    Appearance: well groomed and appropriate hygiene  Behavior: appropriate to circumstances Mood: euthymic Affect: mood congruent Speech: normal in rate, volume, and tone Eye Contact: appropriate Psychomotor Activity: appropriate Gait: unable to assess Thought Process: linear, logical, and goal directed  Thought Content/Perception: no hallucinations, delusions, bizarre thinking or behavior reported or observed and no evidence of suicidal and homicidal ideation, plan, and intent Orientation: time, person, place, and purpose of appointment Memory/Concentration: memory, attention, language, and fund of knowledge intact  Insight/Judgment: good   Interventions:  Conducted a brief chart review Provided empathic reflections and validation Reviewed content from the previous session Employed supportive psychotherapy interventions to facilitate reduced distress and to improve coping skills with identified stressors Psychoeducation provided regarding triggers for emotional eating  DSM-5 Diagnosis(es): 307.59 (F50.8) Other Specified Feeding or Eating Disorder, Emotional Eating Behaviors  Treatment Goal & Progress: During the initial appointment with this provider, the following treatment goal was established: increase coping skills. [name] has demonstrated progress in her goal as evidenced by increased awareness of hunger patterns.   Plan: The next appointment will be scheduled in 2-3 weeks, which will be via MyChart Video Visit. The next session will focus on working towards the established treatment goal.

## 2020-05-23 ENCOUNTER — Encounter (INDEPENDENT_AMBULATORY_CARE_PROVIDER_SITE_OTHER): Payer: Self-pay | Admitting: Family Medicine

## 2020-05-23 ENCOUNTER — Ambulatory Visit (INDEPENDENT_AMBULATORY_CARE_PROVIDER_SITE_OTHER): Payer: 59 | Admitting: Family Medicine

## 2020-05-23 ENCOUNTER — Other Ambulatory Visit: Payer: Self-pay

## 2020-05-23 VITALS — BP 116/78 | HR 69 | Temp 97.6°F | Ht 65.0 in | Wt 192.0 lb

## 2020-05-23 DIAGNOSIS — E669 Obesity, unspecified: Secondary | ICD-10-CM | POA: Diagnosis not present

## 2020-05-23 DIAGNOSIS — Z9189 Other specified personal risk factors, not elsewhere classified: Secondary | ICD-10-CM

## 2020-05-23 DIAGNOSIS — E611 Iron deficiency: Secondary | ICD-10-CM | POA: Diagnosis not present

## 2020-05-23 DIAGNOSIS — E8881 Metabolic syndrome: Secondary | ICD-10-CM

## 2020-05-23 DIAGNOSIS — E559 Vitamin D deficiency, unspecified: Secondary | ICD-10-CM | POA: Diagnosis not present

## 2020-05-23 DIAGNOSIS — E88819 Insulin resistance, unspecified: Secondary | ICD-10-CM

## 2020-05-23 DIAGNOSIS — E66811 Obesity, class 1: Secondary | ICD-10-CM

## 2020-05-23 DIAGNOSIS — Z6832 Body mass index (BMI) 32.0-32.9, adult: Secondary | ICD-10-CM

## 2020-05-23 MED ORDER — VITAMIN D (ERGOCALCIFEROL) 1.25 MG (50000 UNIT) PO CAPS: 50000.0000 [IU] | ORAL_CAPSULE | ORAL | 0 refills | Status: DC

## 2020-05-23 MED ORDER — FERROUS SULFATE 325 (65 FE) MG PO TABS: 325.0000 mg | ORAL_TABLET | Freq: Every day | ORAL | 0 refills | Status: DC

## 2020-05-23 MED FILL — FERROUS SULFATE 325 MG TAB: 325 (65 FE) | 100 days supply | Qty: 100 | Fill #0

## 2020-05-23 MED FILL — VIT D2 1.25 MG (50,000 UNIT: 1.25 MG | 28 days supply | Qty: 4 | Fill #0

## 2020-05-24 NOTE — Progress Notes (Signed)
Chief Complaint:   OBESITY Kimberly Orr is here to discuss her progress with her obesity treatment plan along with follow-up of her obesity related diagnoses. Kimberly Orr is on the Category 2 Plan and states she is following her eating plan approximately 85% of the time. Kimberly Orr states she is doing [employer] for 45 minutes 3 times per week.  Today's visit was #: 3 Starting weight: 191 lbs Starting date: 04/20/2020 Today's weight: 192 lbs Today's date: 05/23/2020 Total lbs lost to date: 0 Total lbs lost since last in-office visit: 0  Interim History: Kimberly Orr says she went to the mountains this weekend with her husband.  She says she ate out and enjoyed herself on vacation.  She made [place]/PC during those times.  When following the plan, she says her hunger was well-controlled.  She had a little bit of cravings at night.  [place] bars helped a lot over the last 2 weeks.    Subjective:   1. Insulin resistance Kimberly Orr has a diagnosis of insulin resistance based on her elevated fasting insulin level >5. She continues to work on diet and exercise to decrease her risk of diabetes.  Lab Results  Component Value Date   INSULIN 14.4 04/20/2020   Lab Results  Component Value Date   HGBA1C 5.5 04/20/2020   2. Vitamin D deficiency Kimberly Orr's Vitamin D level was 33.20 on 04/15/2020. She is currently taking prescription vitamin D 50,000 IU each week. She denies nausea, vomiting or muscle weakness.  3. Iron deficiency Kimberly Orr is not a vegetarian.  She does not have a history of weight loss surgery.   CBC Latest Ref Rng & Units 04/15/2020 04/15/2019 02/03/2018  WBC 4.0 - 10.5 K/uL 8.4 8.1 9.5  Hemoglobin 12.0 - 15.0 g/dL 13.6 13.2 13.6  Hematocrit 36 - 46 % 41.9 40.8 41.2  Platelets 150 - 400 K/uL 271.0 345.0 313.0   Lab Results  Component Value Date   IRON 58 04/15/2020   FERRITIN 12.3 04/15/2020   Lab Results  Component Value Date   VITAMINB12 428 04/20/2020   4. At risk for  dehydration Kimberly Orr is at risk for dehydration due to inadequate water intake.  Assessment/Plan:   1. Insulin resistance Kimberly Orr will continue to work on weight loss, exercise, and decreasing simple carbohydrates to help decrease the risk of diabetes. Kimberly Orr agreed to follow-up with [place] as directed to closely monitor her progress.  2. Vitamin D deficiency Low Vitamin D level contributes to fatigue and are associated with obesity, breast, and colon cancer. She agrees to continue to take prescription Vitamin D @50 ,000 IU every week and will follow-up for routine testing of Vitamin D, at least 2-3 times per year to avoid over-replacement. - Vitamin D, Ergocalciferol, (DRISDOL) 1.25 MG (50000 UNIT) CAPS capsule; Take 1 capsule (50,000 Units total) by mouth every 7 (seven) days.  Dispense: 4 capsule; Refill: 0  3. Iron deficiency Orders and follow up as documented in patient record.  Counseling . Iron is essential for our bodies to make red blood cells.  Reasons that someone may be deficient include: an iron-deficient diet (more likely in those following vegan or vegetarian diets), women with heavy menses, patients with GI disorders or poor absorption, patients that have had bariatric surgery, frequent blood donors, patients with cancer, and patients with heart disease.   Kimberly Orr foods include dark leafy greens, red and white meats, eggs, seafood, and beans.   . Certain foods and drinks prevent your body from absorbing iron  properly. Avoid eating these foods in the same meal as iron-rich foods or with iron supplements. These foods include: coffee, black tea, and red wine; milk, dairy products, and foods that are high in calcium; beans and soybeans; whole grains.  . Constipation can be a side effect of iron supplementation. Increased water and fiber intake are helpful. Water goal: > 2 liters/day. Fiber goal: > 25 grams/day. - ferrous sulfate 325 (65 FE) MG tablet; Take 1 tablet (325 mg total) by  mouth daily with breakfast.  Dispense: 30 tablet; Refill: 0  4. At risk for dehydration Kimberly Orr was given approximately 15 minutes dehydration prevention counseling today. Kimberly Orr is at risk for dehydration due to weight loss and current medication(s). She was encouraged to hydrate and monitor fluid status to avoid dehydration as well as weight loss plateaus.   5. Class 1 obesity with serious comorbidity and body mass index (BMI) of 32.0 to 32.9 in adult, unspecified obesity type Kimberly Orr is currently in the action stage of change. As such, her goal is to continue with weight loss efforts. She has agreed to the Category 2 Plan.   Exercise goals: As is.   If in 2 weeks she has little to now weight loss, will consider starting medications.  Behavioral modification strategies: increasing lean protein intake, decreasing eating out, no skipping meals and planning for success.  Kimberly Orr has agreed to follow-up with our clinic in 2 weeks. She was informed of the importance of frequent follow-up visits to maximize her success with intensive lifestyle modifications for her multiple health conditions.   Objective:   Blood pressure 116/78, pulse 69, temperature 97.6 F (36.4 C), height 5\' 5"  (1.651 m), weight 192 lb (87.1 kg), SpO2 97 %. Body mass index is 31.95 kg/m.  General: Cooperative, alert, well developed, in no acute distress. HEENT: Conjunctivae and lids unremarkable. Cardiovascular: Regular rhythm.  Lungs: Normal work of breathing. Neurologic: No focal deficits.   Lab Results  Component Value Date   CREATININE 0.87 04/15/2020   BUN 10 04/15/2020   NA 142 04/15/2020   K 4.1 04/15/2020   CL 107 04/15/2020   CO2 22 04/15/2020   Lab Results  Component Value Date   ALT 12 04/15/2020   AST 13 04/15/2020   ALKPHOS 83 04/15/2020   BILITOT 0.5 04/15/2020   Lab Results  Component Value Date   HGBA1C 5.5 04/20/2020   Lab Results  Component Value Date   INSULIN 14.4 04/20/2020    Lab Results  Component Value Date   TSH 2.100 04/20/2020   Lab Results  Component Value Date   CHOL 143 04/15/2020   HDL 53.90 04/15/2020   LDLCALC 78 04/15/2020   TRIG 53.0 04/15/2020   CHOLHDL 3 04/15/2020   Lab Results  Component Value Date   WBC 8.4 04/15/2020   HGB 13.6 04/15/2020   HCT 41.9 04/15/2020   MCV 83.5 04/15/2020   PLT 271.0 04/15/2020   Lab Results  Component Value Date   IRON 58 04/15/2020   FERRITIN 12.3 04/15/2020   Attestation Statements:   Reviewed by clinician on day of visit: allergies, medications, problem list, medical history, surgical history, family history, social history, and previous encounter notes.  I, Water quality scientist, CMA, am acting as Location manager for [employer], DO.  I have reviewed the above documentation for accuracy and completeness, and I agree with the above. Mellody Dance, DO

## 2020-05-25 ENCOUNTER — Other Ambulatory Visit: Payer: Self-pay

## 2020-05-25 ENCOUNTER — Telehealth (INDEPENDENT_AMBULATORY_CARE_PROVIDER_SITE_OTHER): Payer: 59 | Admitting: Psychology

## 2020-05-25 DIAGNOSIS — F5089 Other specified eating disorder: Secondary | ICD-10-CM | POA: Diagnosis not present

## 2020-05-31 NOTE — Progress Notes (Unsigned)
Office: [phone]  /  Fax: [phone]    Date: June 14, 2020   Appointment Start Time: *** Duration: *** minutes Provider: Glennie Isle, Psy.D. Type of Session: Individual Therapy  Location of Patient: {gbptloc:23249} Location of Provider: {Location of Service:22491} Type of Contact: Telepsychological Visit via MyChart Video Visit  Session Content: Kimberly Orr is a 34 y.o. female presenting for a follow-up appointment to address the previously established treatment goal of increasing coping skills. Today's appointment was a telepsychological visit due to COVID-19. Kimberly Orr provided verbal consent for today's telepsychological appointment and she is aware she is responsible for securing confidentiality on her end of the session. Prior to proceeding with today's appointment, Kimberly Orr's physical location at the time of this appointment was obtained as well a phone number she could be reached at in the event of technical difficulties. Kimberly Orr and this provider participated in today's telepsychological service.   This provider conducted a brief check-in and verbally administered the PHQ-9 and GAD-7. *** Kimberly Orr was receptive to today's appointment as evidenced by openness to sharing, responsiveness to feedback, and {gbreceptiveness:23401}.  Mental Status Examination:  Appearance: {Appearance:22431} Behavior: {Behavior:22445} Mood: {gbmood:21757} Affect: {Affect:22436} Speech: {Speech:22432} Eye Contact: {Eye Contact:22433} Psychomotor Activity: {Motor Activity:22434} Gait: {gbgait:23404} Thought Process: {thought process:22448}  Thought Content/Perception: {disturbances:22451} Orientation: {Orientation:22437} Memory/Concentration: {gbcognition:22449} Insight/Judgment: {Insight:22446}  Structured Assessments Results: The Patient Health Questionnaire-9 (PHQ-9) is a self-report measure that assesses symptoms and severity of depression over the course of the last two weeks. Kimberly Orr  obtained a score of *** suggesting {GBPHQ9SEVERITY:21752}. Kimberly Orr finds the endorsed symptoms to be {gbphq9difficulty:21754}. [0= Not at all; 1= Several days; 2= More than half the days; 3= Nearly every day] Little interest or pleasure in doing things ***  Feeling down, depressed, or hopeless ***  Trouble falling or staying asleep, or sleeping too much ***  Feeling tired or having little energy ***  Poor appetite or overeating ***  Feeling bad about yourself --- or that you are a failure or have let yourself or your family down ***  Trouble concentrating on things, such as reading the newspaper or watching television ***  Moving or speaking so slowly that other people could have noticed? Or the opposite --- being so fidgety or restless that you have been moving around a lot more than usual ***  Thoughts that you would be better off dead or hurting yourself in some way ***  PHQ-9 Score ***    The Generalized Anxiety Disorder-7 (GAD-7) is a brief self-report measure that assesses symptoms of anxiety over the course of the last two weeks. Kimberly Orr obtained a score of *** suggesting {gbgad7severity:21753}. Kimberly Orr finds the endorsed symptoms to be {gbphq9difficulty:21754}. [0= Not at all; 1= Several days; 2= Over half the days; 3= Nearly every day] Feeling nervous, anxious, on edge ***  Not being able to stop or control worrying ***  Worrying too much about different things ***  Trouble relaxing ***  Being so restless that it's hard to sit still ***  Becoming easily annoyed or irritable ***  Feeling afraid as if something awful might happen ***  GAD-7 Score ***   Interventions:  {Interventions for Progress Notes:23405}  DSM-5 Diagnosis(es): 311 (F32.8) Other Specified Depressive Disorder, Emotional Eating Behaviors  Treatment Goal & Progress: During the initial appointment with this provider, the following treatment goal was established: increase coping skills. Kimberly Orr has demonstrated  progress in her goal as evidenced by {gbtxprogress:22839}. Kimberly Orr also {gbtxprogress2:22951}.  Plan: The next appointment will be scheduled in {gbweeks:21758}, which will  be {gbtxmodality:23402}. The next session will focus on {Plan for Next Appointment:23400}.

## 2020-06-06 ENCOUNTER — Other Ambulatory Visit: Payer: Self-pay

## 2020-06-06 ENCOUNTER — Encounter (INDEPENDENT_AMBULATORY_CARE_PROVIDER_SITE_OTHER): Payer: Self-pay | Admitting: Family Medicine

## 2020-06-06 ENCOUNTER — Ambulatory Visit (INDEPENDENT_AMBULATORY_CARE_PROVIDER_SITE_OTHER): Payer: 59 | Admitting: Family Medicine

## 2020-06-06 VITALS — BP 101/65 | HR 58 | Temp 97.5°F | Ht 65.0 in | Wt 190.0 lb

## 2020-06-06 DIAGNOSIS — E559 Vitamin D deficiency, unspecified: Secondary | ICD-10-CM | POA: Diagnosis not present

## 2020-06-06 DIAGNOSIS — Z6831 Body mass index (BMI) 31.0-31.9, adult: Secondary | ICD-10-CM | POA: Diagnosis not present

## 2020-06-06 DIAGNOSIS — E88819 Insulin resistance, unspecified: Secondary | ICD-10-CM

## 2020-06-06 DIAGNOSIS — Z9189 Other specified personal risk factors, not elsewhere classified: Secondary | ICD-10-CM | POA: Diagnosis not present

## 2020-06-06 DIAGNOSIS — E669 Obesity, unspecified: Secondary | ICD-10-CM | POA: Diagnosis not present

## 2020-06-06 DIAGNOSIS — E8881 Metabolic syndrome: Secondary | ICD-10-CM

## 2020-06-06 DIAGNOSIS — E66811 Obesity, class 1: Secondary | ICD-10-CM

## 2020-06-06 MED ORDER — VITAMIN D (ERGOCALCIFEROL) 1.25 MG (50000 UNIT) PO CAPS: 50000.0000 [IU] | ORAL_CAPSULE | ORAL | 0 refills | Status: DC

## 2020-06-06 NOTE — Progress Notes (Signed)
Chief Complaint:   OBESITY Kimberly Orr is here to discuss her progress with her obesity treatment plan along with follow-up of her obesity related diagnoses. Kimberly Orr is on the Category 2 Plan and states she is following her eating plan approximately 98% of the time. Kimberly Orr states she is doing HIIT and boot camp for 45 minutes 3 times per week.  Today's visit was #: 4 Starting weight: 191 lbs Starting date: 04/20/2020 Today's weight: 190 lbs Today's date: 06/06/2020 Total lbs lost to date: 1 lb  Total lbs lost since last in-office visit: 2 lbs  Interim History: Kimberly Orr says the meal plan is going well.  She has no issues with hunger or cravings, except for when she was on her menses and ordered food one time.  She is currently on her menstrual cycle.  She says boot camp is going well.  She had a birthday party to go to and she had one small piece of cake over the weekend.  She says she exhibited PC/[place] overall at that time.  Subjective:   1. Insulin resistance Kimberly Orr has a diagnosis of insulin resistance based on her elevated fasting insulin level >5. She continues to work on diet and exercise to decrease her risk of diabetes.    Lab Results  Component Value Date   INSULIN 14.4 04/20/2020   Lab Results  Component Value Date   HGBA1C 5.5 04/20/2020   2. Vitamin D deficiency Kimberly Orr's Vitamin D level was 33.20 on 04/15/2020. She is currently taking prescription vitamin D 50,000 IU each week. She denies nausea, vomiting or muscle weakness.  She has had no noticeable difference in fatigue or energy.  3. At risk for osteoporosis Kimberly Orr is at higher risk of osteopenia and osteoporosis due to Vitamin D deficiency.   Assessment/Plan:   1. Insulin resistance Kimberly Orr will continue to work on weight loss, exercise, and decreasing simple carbohydrates to help decrease the risk of diabetes. Kimberly Orr agreed to follow-up with [place] as directed to closely monitor her progress.  Will  recheck labs in 2 months or so.  Continue prudent nutritional plan and weight loss.  2. Vitamin D deficiency Low Vitamin D level contributes to fatigue and are associated with obesity, breast, and colon cancer. She agrees to continue to take prescription Vitamin D @50 ,000 IU every week and will follow-up for routine testing of Vitamin D, at least 2-3 times per year to avoid over-replacement.  Recheck labs in 3 months.  Continue prudent nutritional plan and weight loss. - REFILL Vitamin D, Ergocalciferol, (DRISDOL) 1.25 MG (50000 UNIT) CAPS capsule; Take 1 capsule (50,000 Units total) by mouth every 7 (seven) days.  Dispense: 4 capsule; Refill: 0  3. At risk for osteoporosis Kimberly Orr was given approximately 15 minutes of osteoporosis prevention counseling today. Kimberly Orr is at risk for osteopenia and osteoporosis due to her Vitamin D deficiency. She was encouraged to take her Vitamin D and follow her higher calcium diet and increase strengthening exercise to help strengthen her bones and decrease her risk of osteopenia and osteoporosis.  Repetitive spaced learning was employed today to elicit superior memory formation and behavioral change.  4. Class 1 obesity with serious comorbidity and body mass index (BMI) of 31.0 to 31.9 in adult, unspecified obesity type Kimberly Orr is currently in the action stage of change. As such, her goal is to continue with weight loss efforts. She has agreed to the Category 2 Plan.   Exercise goals: As is.  Behavioral modification strategies: increasing water  intake, celebration eating strategies and planning for success.  Kimberly Orr has agreed to follow-up with our clinic in 2 weeks. She was informed of the importance of frequent follow-up visits to maximize her success with intensive lifestyle modifications for her multiple health conditions.   Objective:   Blood pressure 101/65, pulse (!) 58, temperature (!) 97.5 F (36.4 C), height 5\' 5"  (1.651 m), weight 190 lb  (86.2 kg), SpO2 100 %. Body mass index is 31.62 kg/m.  General: Cooperative, alert, well developed, in no acute distress. HEENT: Conjunctivae and lids unremarkable. Cardiovascular: Regular rhythm.  Lungs: Normal work of breathing. Neurologic: No focal deficits.   Lab Results  Component Value Date   CREATININE 0.87 04/15/2020   BUN 10 04/15/2020   NA 142 04/15/2020   K 4.1 04/15/2020   CL 107 04/15/2020   CO2 22 04/15/2020   Lab Results  Component Value Date   ALT 12 04/15/2020   AST 13 04/15/2020   ALKPHOS 83 04/15/2020   BILITOT 0.5 04/15/2020   Lab Results  Component Value Date   HGBA1C 5.5 04/20/2020   Lab Results  Component Value Date   INSULIN 14.4 04/20/2020   Lab Results  Component Value Date   TSH 2.100 04/20/2020   Lab Results  Component Value Date   CHOL 143 04/15/2020   HDL 53.90 04/15/2020   LDLCALC 78 04/15/2020   TRIG 53.0 04/15/2020   CHOLHDL 3 04/15/2020   Lab Results  Component Value Date   WBC 8.4 04/15/2020   HGB 13.6 04/15/2020   HCT 41.9 04/15/2020   MCV 83.5 04/15/2020   PLT 271.0 04/15/2020   Lab Results  Component Value Date   IRON 58 04/15/2020   FERRITIN 12.3 04/15/2020   Attestation Statements:   Reviewed by clinician on day of visit: allergies, medications, problem list, medical history, surgical history, family history, social history, and previous encounter notes.  I, Water quality scientist, CMA, am acting as Location manager for [employer], DO.  I have reviewed the above documentation for accuracy and completeness, and I agree with the above. Mellody Dance, DO

## 2020-06-14 ENCOUNTER — Telehealth (INDEPENDENT_AMBULATORY_CARE_PROVIDER_SITE_OTHER): Payer: 59 | Admitting: Psychology

## 2020-06-14 ENCOUNTER — Encounter (INDEPENDENT_AMBULATORY_CARE_PROVIDER_SITE_OTHER): Payer: Self-pay

## 2020-06-14 ENCOUNTER — Other Ambulatory Visit: Payer: Self-pay

## 2020-06-14 MED FILL — VIT D2 1.25 MG (50,000 UNIT: 1.25 MG | 28 days supply | Qty: 4 | Fill #0

## 2020-06-20 ENCOUNTER — Encounter: Payer: Self-pay | Admitting: Obstetrics and Gynecology

## 2020-06-20 ENCOUNTER — Ambulatory Visit (INDEPENDENT_AMBULATORY_CARE_PROVIDER_SITE_OTHER): Payer: 59 | Admitting: Obstetrics and Gynecology

## 2020-06-20 ENCOUNTER — Other Ambulatory Visit (HOSPITAL_COMMUNITY)
Admission: RE | Admit: 2020-06-20 | Discharge: 2020-06-20 | Disposition: A | Discharge status: Home / Self Care | Payer: 59 | Source: Ambulatory Visit | Attending: Obstetrics and Gynecology | Admitting: Obstetrics and Gynecology

## 2020-06-20 ENCOUNTER — Ambulatory Visit (INDEPENDENT_AMBULATORY_CARE_PROVIDER_SITE_OTHER): Payer: 59 | Admitting: Family Medicine

## 2020-06-20 ENCOUNTER — Ambulatory Visit: Payer: 59 | Admitting: Obstetrics and Gynecology

## 2020-06-20 ENCOUNTER — Encounter (INDEPENDENT_AMBULATORY_CARE_PROVIDER_SITE_OTHER): Payer: Self-pay | Admitting: Family Medicine

## 2020-06-20 ENCOUNTER — Other Ambulatory Visit: Payer: Self-pay

## 2020-06-20 VITALS — BP 106/74 | HR 58 | Temp 97.6°F | Ht 65.0 in | Wt 190.0 lb

## 2020-06-20 VITALS — BP 119/80 | HR 73 | Ht 65.0 in | Wt 190.0 lb

## 2020-06-20 DIAGNOSIS — Z9189 Other specified personal risk factors, not elsewhere classified: Secondary | ICD-10-CM | POA: Diagnosis not present

## 2020-06-20 DIAGNOSIS — Z01419 Encounter for gynecological examination (general) (routine) without abnormal findings: Secondary | ICD-10-CM

## 2020-06-20 DIAGNOSIS — Z124 Encounter for screening for malignant neoplasm of cervix: Secondary | ICD-10-CM | POA: Diagnosis not present

## 2020-06-20 DIAGNOSIS — D509 Iron deficiency anemia, unspecified: Secondary | ICD-10-CM

## 2020-06-20 DIAGNOSIS — E669 Obesity, unspecified: Secondary | ICD-10-CM | POA: Diagnosis not present

## 2020-06-20 DIAGNOSIS — N644 Mastodynia: Secondary | ICD-10-CM | POA: Diagnosis not present

## 2020-06-20 DIAGNOSIS — Z6831 Body mass index (BMI) 31.0-31.9, adult: Secondary | ICD-10-CM | POA: Diagnosis not present

## 2020-06-20 DIAGNOSIS — E559 Vitamin D deficiency, unspecified: Secondary | ICD-10-CM | POA: Diagnosis not present

## 2020-06-20 DIAGNOSIS — E8881 Metabolic syndrome: Secondary | ICD-10-CM

## 2020-06-20 MED ORDER — METFORMIN HCL 500 MG PO TABS: 500.0000 mg | ORAL_TABLET | Freq: Two times a day (BID) | ORAL | 0 refills | Status: DC

## 2020-06-20 MED FILL — METFORMIN HCL 500 MG TABS: 500 | 30 days supply | Qty: 60 | Fill #0

## 2020-06-20 NOTE — Progress Notes (Signed)
GYNECOLOGY ANNUAL PREVENTATIVE CARE ENCOUNTER NOTE  Subjective:   Kimberly Orr is a 34 y.o. G29P2002 female here for a annual gynecologic exam. Current complaints: left sided breast tenderness x 3 weeks, has not improved, felt small lump there at beginning of tenderness, no known trauma to area  Denies abnormal vaginal bleeding, discharge, pelvic pain, problems with intercourse or other gynecologic concerns. Declines STI screen.   Gynecologic History Patient's last menstrual period was 06/03/2020. Contraception: vasectomy Last Pap: 08/2017. Results: normal Last mammogram: n/a  Obstetric History OB History  Gravida Para Term Preterm AB Living  2 2 2     2   SAB TAB Ectopic Multiple Live Births               # Outcome Date GA Lbr Len/2nd Weight Sex Delivery Anes PTL Lv  2 Term           1 Term             Past Medical History:  Diagnosis Date  . Anemia   . Basal cell carcinoma   . Depression   . GERD (gastroesophageal reflux disease)   . History of chicken pox   . Joint pain   . Weight gain     Past Surgical History:  Procedure Laterality Date  . DILATION AND CURETTAGE OF UTERUS  2011  . WISDOM TOOTH EXTRACTION      Current Outpatient Medications on File Prior to Visit  Medication Sig Dispense Refill  . ferrous sulfate 325 (65 FE) MG tablet Take 1 tablet (325 mg total) by mouth daily with breakfast. (Patient not taking: Reported on 06/20/2020) 30 tablet 0  . ibuprofen (ADVIL) 600 MG tablet Take 600 mg by mouth as needed. (Patient not taking: Reported on 06/20/2020)    . Multiple Vitamins-Minerals (WOMENS MULTIVITAMIN PO)  (Patient not taking: Reported on 06/20/2020)    . Vitamin D, Ergocalciferol, (DRISDOL) 1.25 MG (50000 UNIT) CAPS capsule Take 1 capsule (50,000 Units total) by mouth every 7 (seven) days. (Patient not taking: Reported on 06/20/2020) 4 capsule 0   No current facility-administered medications on file prior to visit.    No Known  Allergies  Social History   Socioeconomic History  . Marital status: Married    Spouse name: Not on file  . Number of children: Not on file  . Years of education: Not on file  . Highest education level: Not on file  Occupational History  . Occupation: Nurse  Tobacco Use  . Smoking status: Never Smoker  . Smokeless tobacco: Never Used  Substance and Sexual Activity  . Alcohol use: No  . Drug use: No  . Sexual activity: Yes    Partners: Male    Birth control/protection: Pill  Other Topics Concern  . Not on file  Social History Narrative  . Not on file   Social Determinants of Health   Financial Resource Strain:   . Difficulty of Paying Living Expenses: Not on file  Food Insecurity:   . Worried About Charity fundraiser in the Last Year: Not on file  . Ran Out of Food in the Last Year: Not on file  Transportation Needs:   . Lack of Transportation (Medical): Not on file  . Lack of Transportation (Non-Medical): Not on file  Physical Activity:   . Days of Exercise per Week: Not on file  . Minutes of Exercise per Session: Not on file  Stress:   . Feeling of Stress : Not  on file  Social Connections:   . Frequency of Communication with Friends and Family: Not on file  . Frequency of Social Gatherings with Friends and Family: Not on file  . Attends Religious Services: Not on file  . Active Member of Clubs or Organizations: Not on file  . Attends Archivist Meetings: Not on file  . Marital Status: Not on file  Intimate Partner Violence:   . Fear of Current or Ex-Partner: Not on file  . Emotionally Abused: Not on file  . Physically Abused: Not on file  . Sexually Abused: Not on file    Family History  Problem Relation Age of Onset  . Skin cancer Other        mother and father   . Heart attack Maternal Grandfather   . Hypertension Maternal Grandfather   . Heart disease Maternal Grandfather   . Cancer Mother        pre-cancerous skin  . Cancer Father         precancerous skin  . Hyperlipidemia Father   . Parkinson's disease Maternal Grandmother   . Cancer Paternal Grandmother        breast cancer  . Cancer Paternal Grandfather        prostate cancer    The following portions of the patient's history were reviewed and updated as appropriate: allergies, current medications, past family history, past medical history, past social history, past surgical history and problem list.  Review of Systems Pertinent items are noted in HPI.   Objective:  BP 119/80   Pulse 73   Ht 5\' 5"  (1.651 m)   Wt 190 lb (86.2 kg)   LMP 06/03/2020   BMI 31.62 kg/m  CONSTITUTIONAL: Well-developed, well-nourished female in no acute distress.  HENT:  Normocephalic, atraumatic, External right and left ear normal. Oropharynx is clear and moist EYES: Conjunctivae and EOM are normal. Pupils are equal, round, and reactive to light. No scleral icterus.  NECK: Normal range of motion, supple, no masses.  Normal thyroid.  SKIN: Skin is warm and dry. No rash noted. Not diaphoretic. No erythema. No pallor. NEUROLOGIC: Alert and oriented to person, place, and time. Normal reflexes, muscle tone coordination. No cranial nerve deficit noted. PSYCHIATRIC: Normal mood and affect. Normal behavior. Normal judgment and thought content. CARDIOVASCULAR: Normal heart rate noted, regular rhythm RESPIRATORY: Clear to auscultation bilaterally. Effort and breath sounds normal, no problems with respiration noted. BREASTS: Symmetric in size. No masses, skin changes, nipple drainage, or lymphadenopathy on right. Small 1 cm ridge palpated at 1-2 o;clock in left breast, approx 3-4 cm from areola. ABDOMEN: Soft, no distention noted.  No tenderness, rebound or guarding.  PELVIC: Normal appearing external genitalia; normal appearing vaginal mucosa and cervix.  No abnormal discharge noted.  Pap smear obtained.  MUSCULOSKELETAL: Normal range of motion. No tenderness.  No cyanosis, clubbing, or edema.   2+ distal pulses.  Exam done with chaperone present.  Assessment and Plan:   1. Well woman exam Healthy female exam  2. Cervical cancer screening - Cytology - PAP( [place])  3. Breast tenderness - small ridge/lump palpable - breast US ordered - US Unlisted Procedure Breast; Future   Will follow up results of pap smear and manage accordingly. Encouraged improvement in diet and exercise.  Declines STI screen. COVID vaccine UTD Mammogram n/a Referral for colonoscopy  Flu vaccine declines today, will get later   Routine preventative health maintenance measures emphasized. Please refer to After Visit Summary for other  counseling recommendations.   Total face-to-face time with patient: 25 minutes. Over 50% of encounter was spent on counseling and coordination of care.   Feliz Beam, M.D. Attending Center for [employer] Fish farm manager)

## 2020-06-20 NOTE — Progress Notes (Signed)
Chief Complaint:   OBESITY Kimberly Orr is here to discuss her progress with her obesity treatment plan along with follow-up of her obesity related diagnoses. Kimberly Orr is on the Category 2 Plan and states she is following her eating plan approximately 95% of the time. Kimberly Orr states she is doing [employer] for 45 minutes 3 times per week.  Today's visit was #: 5 Starting weight: 191 lbs Starting date: 04/20/2020 Today's weight: 190 lbs Today's date: 06/27/2020 Total lbs lost to date: 1 Total lbs lost since last in-office visit: 0  Interim History: Kimberly Orr says her oldest turned 13 this weekend.  She had cauliflower crust pizza.  She is not skipping meals.  She may be having a light Muscle Milk at times for breakfast.  No condiments or hidden calories.  She occasionally cravings in the evenings, for which she uses [employer] bars.  Subjective:   1. Insulin resistance Kimberly Orr has a diagnosis of insulin resistance based on her elevated fasting insulin level >5. She continues to work on diet and exercise to decrease her risk of diabetes.  Lab Results  Component Value Date   INSULIN 14.4 04/20/2020   Lab Results  Component Value Date   HGBA1C 5.5 04/20/2020   2. Vitamin D deficiency Kimberly Orr's Vitamin D level was 33.20 on 04/15/2020. She is currently taking prescription vitamin D 50,000 IU each week. She denies nausea, vomiting or muscle weakness.  3. Iron deficiency anemia, unspecified iron deficiency anemia type Kimberly Orr is not a vegetarian.  She does not have a history of weight loss surgery.  She is taking an iron supplement every other day due to constipation.  She can tolerate that okay without many GI side effects.  CBC Latest Ref Rng & Units 04/15/2020 04/15/2019 02/03/2018  WBC 4.0 - 10.5 K/uL 8.4 8.1 9.5  Hemoglobin 12.0 - 15.0 g/dL 13.6 13.2 13.6  Hematocrit 36 - 46 % 41.9 40.8 41.2  Platelets 150 - 400 K/uL 271.0 345.0 313.0   Lab Results  Component Value Date   IRON  58 04/15/2020   FERRITIN 12.3 04/15/2020   Lab Results  Component Value Date   VITAMINB12 428 04/20/2020   4. At risk for diabetes mellitus Kimberly Orr is at higher than average risk for developing diabetes due to her obesity.   Assessment/Plan:   1. Insulin resistance Kimberly Orr will continue to work on weight loss, exercise, and decreasing simple carbohydrates to help decrease the risk of diabetes. Kimberly Orr agreed to follow-up with [place] as directed to closely monitor her progress.  -Start metFORMIN (GLUCOPHAGE) 500 MG tablet; Take 1 tablet (500 mg total) by mouth in the morning and at bedtime.  Dispense: 60 tablet; Refill: 0  2. Vitamin D deficiency Low Vitamin D level contributes to fatigue and are associated with obesity, breast, and colon cancer. She agrees to continue to take prescription Vitamin D @50 ,000 IU every week and will follow-up for routine testing of Vitamin D, at least 2-3 times per year to avoid over-replacement.  Recheck labs in 3 months.  3. Iron deficiency anemia, unspecified iron deficiency anemia type Orders and follow up as documented in patient record.  Continue medications.  Continue taking psyllium husk 2 tablets daily and drink around 100 ounces of water per day.  Take MiraLAX as needed.  Counseling . Iron is essential for our bodies to make red blood cells.  Reasons that someone may be deficient include: an iron-deficient diet (more likely in those following vegan or vegetarian diets), women  with heavy menses, patients with GI disorders or poor absorption, patients that have had bariatric surgery, frequent blood donors, patients with cancer, and patients with heart disease.   Kimberly Orr foods include dark leafy greens, red and white meats, eggs, seafood, and beans.   . Certain foods and drinks prevent your body from absorbing iron properly. Avoid eating these foods in the same meal as iron-rich foods or with iron supplements. These foods include: coffee, black tea,  and red wine; milk, dairy products, and foods that are high in calcium; beans and soybeans; whole grains.  . Constipation can be a side effect of iron supplementation. Increased water and fiber intake are helpful. Water goal: > 2 liters/day. Fiber goal: > 25 grams/day.  4. At risk for diabetes mellitus Kimberly Orr was given approximately 8 minutes of diabetes education and counseling today. We discussed intensive lifestyle modifications today with an emphasis on weight loss as well as increasing exercise and decreasing simple carbohydrates in her diet. We also reviewed medication options with an emphasis on risk versus benefit of those discussed.  - Kimberly Orr was given extensive diabetes prevention education and counseling today.  - Counseled patient on pathophysiology of disease and discussed various treatment options which always includes dietary and lifestyle modification as first line.   - Importance of healthy diet with very limited amounts of simple carbohydrates discussed with patient in addition to regular aerobic exercise of 49min 5d/week or more.  - Handouts provided at patient's desire and or told to go online at the American Diabetes Association website for further information    5. Class 1 obesity with serious comorbidity and body mass index (BMI) of 31.0 to 31.9 in adult, unspecified obesity type Kimberly Orr is currently in the action stage of change. As such, her goal is to continue with weight loss efforts. She has agreed to the Category 2 Plan.   Exercise goals: As is.  Behavioral modification strategies: increasing lean protein intake, decreasing simple carbohydrates, increasing water intake, increasing high fiber foods and no skipping meals.  Continue some exercise, adding metformin.   Kimberly Orr has agreed to follow-up with our clinic in 2 weeks. She was informed of the importance of frequent follow-up visits to maximize her success with intensive lifestyle modifications for her  multiple health conditions.   Objective:   Pulse (!) 58, temperature 97.6 F (36.4 C), height 5\' 5"  (1.651 m), weight 190 lb (86.2 kg), last menstrual period 06/03/2020, SpO2 98 %. Body mass index is 31.62 kg/m.  General: Cooperative, alert, well developed, in no acute distress. HEENT: Conjunctivae and lids unremarkable. Cardiovascular: Regular rhythm.  Lungs: Normal work of breathing. Neurologic: No focal deficits.   Lab Results  Component Value Date   CREATININE 0.87 04/15/2020   BUN 10 04/15/2020   NA 142 04/15/2020   K 4.1 04/15/2020   CL 107 04/15/2020   CO2 22 04/15/2020   Lab Results  Component Value Date   ALT 12 04/15/2020   AST 13 04/15/2020   ALKPHOS 83 04/15/2020   BILITOT 0.5 04/15/2020   Lab Results  Component Value Date   HGBA1C 5.5 04/20/2020   Lab Results  Component Value Date   INSULIN 14.4 04/20/2020   Lab Results  Component Value Date   TSH 2.100 04/20/2020   Lab Results  Component Value Date   CHOL 143 04/15/2020   HDL 53.90 04/15/2020   LDLCALC 78 04/15/2020   TRIG 53.0 04/15/2020   CHOLHDL 3 04/15/2020   Lab Results  Component Value Date   WBC 8.4 04/15/2020   HGB 13.6 04/15/2020   HCT 41.9 04/15/2020   MCV 83.5 04/15/2020   PLT 271.0 04/15/2020   Lab Results  Component Value Date   IRON 58 04/15/2020   FERRITIN 12.3 04/15/2020   Attestation Statements:   Reviewed by clinician on day of visit: allergies, medications, problem list, medical history, surgical history, family history, social history, and previous encounter notes.  I, Water quality scientist, CMA, am acting as Location manager for [employer], DO.  I have reviewed the above documentation for accuracy and completeness, and I agree with the above. -  Mellody Dance, DO

## 2020-06-22 LAB — CYTOLOGY - PAP
Comment: NEGATIVE
Diagnosis: NEGATIVE
Diagnosis: REACTIVE
High risk HPV: NEGATIVE

## 2020-06-24 ENCOUNTER — Other Ambulatory Visit: Payer: Self-pay | Admitting: Obstetrics and Gynecology

## 2020-06-24 DIAGNOSIS — N644 Mastodynia: Secondary | ICD-10-CM

## 2020-06-27 ENCOUNTER — Encounter (INDEPENDENT_AMBULATORY_CARE_PROVIDER_SITE_OTHER): Payer: Self-pay | Admitting: Family Medicine

## 2020-07-04 ENCOUNTER — Other Ambulatory Visit: Payer: Self-pay

## 2020-07-04 ENCOUNTER — Encounter (INDEPENDENT_AMBULATORY_CARE_PROVIDER_SITE_OTHER): Payer: Self-pay | Admitting: Family Medicine

## 2020-07-04 ENCOUNTER — Ambulatory Visit (INDEPENDENT_AMBULATORY_CARE_PROVIDER_SITE_OTHER): Payer: 59 | Admitting: Family Medicine

## 2020-07-04 VITALS — BP 109/78 | HR 59 | Temp 98.1°F | Ht 65.0 in | Wt 190.0 lb

## 2020-07-04 DIAGNOSIS — D508 Other iron deficiency anemias: Secondary | ICD-10-CM | POA: Diagnosis not present

## 2020-07-04 DIAGNOSIS — E66811 Obesity, class 1: Secondary | ICD-10-CM

## 2020-07-04 DIAGNOSIS — Z6831 Body mass index (BMI) 31.0-31.9, adult: Secondary | ICD-10-CM | POA: Diagnosis not present

## 2020-07-04 DIAGNOSIS — E669 Obesity, unspecified: Secondary | ICD-10-CM | POA: Diagnosis not present

## 2020-07-04 DIAGNOSIS — E559 Vitamin D deficiency, unspecified: Secondary | ICD-10-CM | POA: Diagnosis not present

## 2020-07-04 DIAGNOSIS — Z9189 Other specified personal risk factors, not elsewhere classified: Secondary | ICD-10-CM | POA: Diagnosis not present

## 2020-07-04 DIAGNOSIS — E8881 Metabolic syndrome: Secondary | ICD-10-CM | POA: Diagnosis not present

## 2020-07-04 DIAGNOSIS — E88819 Insulin resistance, unspecified: Secondary | ICD-10-CM

## 2020-07-04 MED ORDER — VITAMIN D (ERGOCALCIFEROL) 1.25 MG (50000 UNIT) PO CAPS: 50000.0000 [IU] | ORAL_CAPSULE | ORAL | 0 refills | Status: DC

## 2020-07-06 MED FILL — VIT D2 1.25 MG (50,000 UNIT: 1.25 MG | 28 days supply | Qty: 4 | Fill #0

## 2020-07-06 NOTE — Progress Notes (Signed)
Chief Complaint:   OBESITY Kimberly Orr is here to discuss her progress with her obesity treatment plan along with follow-up of her obesity related diagnoses. Kimberly Orr is on the Category 2 Plan and states she is following her eating plan approximately 85% of the time. Kimberly Orr states she is doing [employer] for 45 minutes 3 times per week.  Today's visit was #: 6 Starting weight: 191 lbs Starting date: 04/20/2020 Today's weight: 190 lbs Today's date: 07/04/2020 Total lbs lost to date: 1 lb Total lbs lost since last in-office visit: 0  Interim History: Kimberly Orr went on a "vacation" for a long weekend to visit family.  They cooked 3 meals/day, she says, over the long weekend.  She did off-plan eating.  She had some restraint - ate more meat and vegetables or at least tried to.  For snack calories, she had Yasso bars and string cheese.  Subjective:   1. Vitamin D deficiency Kimberly Orr's Vitamin D level was 33.20 on 04/15/2020. She is currently taking prescription vitamin D 50,000 IU each week. She denies nausea, vomiting or muscle weakness.  2. Insulin resistance Kimberly Orr has a diagnosis of insulin resistance based on her elevated fasting insulin level >5. She continues to work on diet and exercise to decrease her risk of diabetes.  Started on metformin at last office visit.  Some GI side effects initially, but tolerating well.  Lab Results  Component Value Date   INSULIN 14.4 04/20/2020   Lab Results  Component Value Date   HGBA1C 5.5 04/20/2020   3. Other iron deficiency anemia Kimberly Orr is not a vegetarian.  She does not have a history of weight loss surgery.  She is taking an iron supplement.   CBC Latest Ref Rng & Units 04/15/2020 04/15/2019 02/03/2018  WBC 4.0 - 10.5 K/uL 8.4 8.1 9.5  Hemoglobin 12.0 - 15.0 g/dL 13.6 13.2 13.6  Hematocrit 36 - 46 % 41.9 40.8 41.2  Platelets 150 - 400 K/uL 271.0 345.0 313.0   Lab Results  Component Value Date   IRON 58 04/15/2020    FERRITIN 12.3 04/15/2020   Lab Results  Component Value Date   VITAMINB12 428 04/20/2020   4. At risk for hyperglycemia Kimberly Orr has a history of some elevated blood glucose readings without a diagnosis of diabetes. She denies polyphagia.  Assessment/Plan:   1. Vitamin D deficiency Low Vitamin D level contributes to fatigue and are associated with obesity, breast, and colon cancer. She agrees to continue to take prescription Vitamin D @50 ,000 IU every week and will follow-up for routine testing of Vitamin D, at least 2-3 times per year to avoid over-replacement.  Will check labs at next office visit.  -Refill Vitamin D, Ergocalciferol, (DRISDOL) 1.25 MG (50000 UNIT) CAPS capsule; Take 1 capsule (50,000 Units total) by mouth every 7 (seven) days.  Dispense: 4 capsule; Refill: 0 - VITAMIN D 25 Hydroxy (Vit-D Deficiency, Fractures)  2. Insulin resistance Kimberly Orr will continue to work on weight loss, exercise, and decreasing simple carbohydrates to help decrease the risk of diabetes. Kimberly Orr agreed to follow-up with [place] as directed to closely monitor her progress.  Change to taking first tablet of metformin at 11-12 noon and second tablet at 7 pm or so to help with evening cravings/huinger.  Continue medication.  Check labs at next office visit.  Continue prudent nutritional plan and weight loss.  - Hemoglobin A1c - Insulin, random  3. Other iron deficiency anemia  Orders and follow up as documented in  patient record.  Check labs at next office visit (anemia panel and CBC).  Counseling . Iron is essential for our bodies to make red blood cells.  Reasons that someone may be deficient include: an iron-deficient diet (more likely in those following vegan or vegetarian diets), women with heavy menses, patients with GI disorders or poor absorption, patients that have had bariatric surgery, frequent blood donors, patients with cancer, and patients with heart disease.   Kimberly Orr foods include  dark leafy greens, red and white meats, eggs, seafood, and beans.   . Certain foods and drinks prevent your body from absorbing iron properly. Avoid eating these foods in the same meal as iron-rich foods or with iron supplements. These foods include: coffee, black tea, and red wine; milk, dairy products, and foods that are high in calcium; beans and soybeans; whole grains.  . Constipation can be a side effect of iron supplementation. Increased water and fiber intake are helpful. Water goal: > 2 liters/day. Fiber goal: > 25 grams/day.  - CBC with Differential/Platelet - Anemia panel  4. At risk for hyperglycemia Kimberly Orr was given approximately 13 minutes of counseling today regarding prevention of hyperglycemia. She was advised of hyperglycemia causes and the fact hyperglycemia is often asymptomatic. Kimberly Orr was instructed to avoid skipping meals, eat regular protein rich meals and schedule low calorie but protein rich snacks as needed.   Evidence-based interventions for health behavior change were utilized today including the use of self monitoring techniques, problem-solving barriers and SMART goal setting techniques.    Specifically regarding patient's less desirable eating habits and patterns, we employed the technique of small changes when Kimberly Orr has not been able to fully commit to her prudent nutritional plan.   5. Class 1 obesity with serious comorbidity and body mass index (BMI) of 31.0 to 31.9 in adult, unspecified obesity type Kimberly Orr is currently in the action stage of change. As such, her goal is to continue with weight loss efforts. She has agreed to the Category 2 Plan.   Exercise goals: Increase to 5 days per week (30 minutes the other 2 days of the week).  Behavioral modification strategies: increasing lean protein intake, decreasing simple carbohydrates, decreasing eating out and planning for success.  Kimberly Orr has agreed to follow-up with our clinic in 2-3 weeks. She was  informed of the importance of frequent follow-up visits to maximize her success with intensive lifestyle modifications for her multiple health conditions.   Kimberly Orr was informed we would discuss her lab results at her next visit unless there is a critical issue that needs to be addressed sooner. Kimberly Orr agreed to keep her next visit at the agreed upon time to discuss these results.  Objective:   Blood pressure 109/78, pulse (!) 59, temperature 98.1 F (36.7 C), height 5\' 5"  (1.651 m), weight 190 lb (86.2 kg), SpO2 98 %. Body mass index is 31.62 kg/m.  General: Cooperative, alert, well developed, in no acute distress. HEENT: Conjunctivae and lids unremarkable. Cardiovascular: Regular rhythm.  Lungs: Normal work of breathing. Neurologic: No focal deficits.   Lab Results  Component Value Date   CREATININE 0.87 04/15/2020   BUN 10 04/15/2020   NA 142 04/15/2020   K 4.1 04/15/2020   CL 107 04/15/2020   CO2 22 04/15/2020   Lab Results  Component Value Date   ALT 12 04/15/2020   AST 13 04/15/2020   ALKPHOS 83 04/15/2020   BILITOT 0.5 04/15/2020   Lab Results  Component Value Date   HGBA1C  5.5 04/20/2020   Lab Results  Component Value Date   INSULIN 14.4 04/20/2020   Lab Results  Component Value Date   TSH 2.100 04/20/2020   Lab Results  Component Value Date   CHOL 143 04/15/2020   HDL 53.90 04/15/2020   LDLCALC 78 04/15/2020   TRIG 53.0 04/15/2020   CHOLHDL 3 04/15/2020   Lab Results  Component Value Date   WBC 8.4 04/15/2020   HGB 13.6 04/15/2020   HCT 41.9 04/15/2020   MCV 83.5 04/15/2020   PLT 271.0 04/15/2020   Lab Results  Component Value Date   IRON 58 04/15/2020   FERRITIN 12.3 04/15/2020   Attestation Statements:   Reviewed by clinician on day of visit: allergies, medications, problem list, medical history, surgical history, family history, social history, and previous encounter notes.  I, Water quality scientist, CMA, am acting as Location manager for  [employer], DO.  I have reviewed the above documentation for accuracy and completeness, and I agree with the above. Mellody Dance, DO

## 2020-07-08 ENCOUNTER — Other Ambulatory Visit: Payer: Self-pay

## 2020-07-08 ENCOUNTER — Ambulatory Visit
Admission: RE | Admit: 2020-07-08 | Discharge: 2020-07-08 | Disposition: A | Discharge status: Home / Self Care | Payer: 59 | Source: Ambulatory Visit | Attending: Obstetrics and Gynecology | Admitting: Obstetrics and Gynecology

## 2020-07-08 ENCOUNTER — Other Ambulatory Visit: Payer: Self-pay | Admitting: Obstetrics and Gynecology

## 2020-07-08 DIAGNOSIS — N6310 Unspecified lump in the right breast, unspecified quadrant: Secondary | ICD-10-CM

## 2020-07-08 DIAGNOSIS — N644 Mastodynia: Secondary | ICD-10-CM

## 2020-07-08 DIAGNOSIS — R922 Inconclusive mammogram: Secondary | ICD-10-CM | POA: Diagnosis not present

## 2020-07-08 DIAGNOSIS — N6011 Diffuse cystic mastopathy of right breast: Secondary | ICD-10-CM | POA: Diagnosis not present

## 2020-07-08 DIAGNOSIS — N6012 Diffuse cystic mastopathy of left breast: Secondary | ICD-10-CM | POA: Diagnosis not present

## 2020-07-12 DIAGNOSIS — L814 Other melanin hyperpigmentation: Secondary | ICD-10-CM | POA: Diagnosis not present

## 2020-07-12 DIAGNOSIS — D229 Melanocytic nevi, unspecified: Secondary | ICD-10-CM | POA: Diagnosis not present

## 2020-07-12 DIAGNOSIS — L578 Other skin changes due to chronic exposure to nonionizing radiation: Secondary | ICD-10-CM | POA: Diagnosis not present

## 2020-07-12 DIAGNOSIS — D2261 Melanocytic nevi of right upper limb, including shoulder: Secondary | ICD-10-CM | POA: Diagnosis not present

## 2020-07-12 DIAGNOSIS — D2262 Melanocytic nevi of left upper limb, including shoulder: Secondary | ICD-10-CM | POA: Diagnosis not present

## 2020-07-12 DIAGNOSIS — X32XXXS Exposure to sunlight, sequela: Secondary | ICD-10-CM | POA: Diagnosis not present

## 2020-07-12 DIAGNOSIS — Z85828 Personal history of other malignant neoplasm of skin: Secondary | ICD-10-CM | POA: Diagnosis not present

## 2020-07-25 ENCOUNTER — Encounter (INDEPENDENT_AMBULATORY_CARE_PROVIDER_SITE_OTHER): Payer: Self-pay | Admitting: Family Medicine

## 2020-07-25 ENCOUNTER — Ambulatory Visit (INDEPENDENT_AMBULATORY_CARE_PROVIDER_SITE_OTHER): Payer: 59 | Admitting: Family Medicine

## 2020-07-25 ENCOUNTER — Other Ambulatory Visit (INDEPENDENT_AMBULATORY_CARE_PROVIDER_SITE_OTHER): Payer: Self-pay | Admitting: Family Medicine

## 2020-07-25 ENCOUNTER — Other Ambulatory Visit: Payer: Self-pay

## 2020-07-25 VITALS — BP 118/7 | HR 62 | Temp 98.1°F | Ht 65.0 in | Wt 191.0 lb

## 2020-07-25 DIAGNOSIS — D508 Other iron deficiency anemias: Secondary | ICD-10-CM | POA: Diagnosis not present

## 2020-07-25 DIAGNOSIS — E669 Obesity, unspecified: Secondary | ICD-10-CM | POA: Diagnosis not present

## 2020-07-25 DIAGNOSIS — E8881 Metabolic syndrome: Secondary | ICD-10-CM

## 2020-07-25 DIAGNOSIS — Z6831 Body mass index (BMI) 31.0-31.9, adult: Secondary | ICD-10-CM

## 2020-07-25 DIAGNOSIS — E559 Vitamin D deficiency, unspecified: Secondary | ICD-10-CM

## 2020-07-25 DIAGNOSIS — Z9189 Other specified personal risk factors, not elsewhere classified: Secondary | ICD-10-CM

## 2020-07-25 MED ORDER — VITAMIN D (ERGOCALCIFEROL) 1.25 MG (50000 UNIT) PO CAPS: 50000.0000 [IU] | ORAL_CAPSULE | ORAL | 0 refills | Status: DC

## 2020-07-25 MED ORDER — METFORMIN HCL 500 MG PO TABS: 500.0000 mg | ORAL_TABLET | Freq: Two times a day (BID) | ORAL | 0 refills | Status: DC

## 2020-07-25 MED FILL — METFORMIN HCL 500 MG TABS: 500 | 30 days supply | Qty: 60 | Fill #0

## 2020-07-25 NOTE — Progress Notes (Signed)
Chief Complaint:   OBESITY Kimberly Orr is here to discuss her progress with her obesity treatment plan along with follow-up of her obesity related diagnoses. Kimberly Orr is on the Category 2 Plan and states she is following her eating plan approximately 90% of the time. Kimberly Orr states she is doing [employer] for 45 minutes 3 times per week.  Today's visit was #: 7 Starting weight: 191 lbs Starting date: 04/20/2020 Today's weight: 191 lbs Today's date: 07/25/2020 Total lbs lost to date: 0 Total lbs lost since last in-office visit: 0  Interim History: -  Kimberly Orr says she is starting her menstrual cycle later this week, and she says that usually 3-4 pounds of weight gain occurs with that.   For breakfast, she has eggs, chicken bacon, usually no toast, and occasionally will have a Muscle Milk instead for breakfast.  For lunch, Joseph's wrap, [place] breast (measuring amounts), has apple or 1 piece of fruit.  For dinner, chicken and salmon and brown rice.  She is taking metformin at 11 am or so with lunch and at 7 or so with dinner.    - For snacks, she has [employer], cheese and nut pre-packaged.  Hunger and cravings are under good control.  Subjective:   1. Insulin resistance Kimberly Orr has a diagnosis of insulin resistance based on her elevated fasting insulin level >5. She continues to work on diet and exercise to decrease her risk of diabetes.  Denies side effects of medications.  Tolerating well.  Helping with cravings a lot.  No concerns.   Lab Results  Component Value Date   INSULIN 14.4 04/20/2020   Lab Results  Component Value Date   HGBA1C 5.5 04/20/2020    2. Other iron deficiency anemia Kimberly Orr is not a vegetarian.  She does not have a history of weight loss surgery.  Taking iron supplement most days, but usually takes every other day due to constipation. -   Uses MiraLAX, psyllium husks.  Gets to 100 ounces of water per day on average.   CBC Latest Ref Rng & Units  04/15/2020 04/15/2019 02/03/2018  WBC 4.0 - 10.5 K/uL 8.4 8.1 9.5  Hemoglobin 12.0 - 15.0 g/dL 13.6 13.2 13.6  Hematocrit 36 - 46 % 41.9 40.8 41.2  Platelets 150 - 400 K/uL 271.0 345.0 313.0   Lab Results  Component Value Date   IRON 58 04/15/2020   FERRITIN 12.3 04/15/2020   Lab Results  Component Value Date   VITAMINB12 428 04/20/2020    3. Vitamin D deficiency Kimberly Orr's Vitamin D level was 33.20 on 04/15/2020. She is currently taking prescription vitamin D 50,000 IU each week. She denies nausea, vomiting or muscle weakness.    Current Outpatient Medications:  .  ferrous sulfate 325 (65 FE) MG tablet, Take 1 tablet (325 mg total) by mouth daily with breakfast, Disp: 30 tablet, Rfl: 0  .  ibuprofen (ADVIL) 600 MG tablet, Take 600 mg by mouth as needed. (Patient not taking: Reported on 06/20/2020), Disp: , Rfl:   .  metFORMIN (GLUCOPHAGE) 500 MG tablet, Take 1 tablet (500 mg total) by mouth in the morning and at bedtime., Disp: 60 tablet, Rfl: 0  .  Multiple Vitamins-Minerals (WOMENS MULTIVITAMIN PO), , Disp: , Rfl:   .  Vitamin D, Ergocalciferol, (DRISDOL) 1.25 MG (50000 UNIT) CAPS capsule, Take 1 capsule (50,000 Units total) by mouth every 7 (seven) days., Disp: 4 capsule, Rfl: 0   Assessment/Plan:   1. Insulin resistance Kimberly Orr will continue  to work on weight loss, exercise, and decreasing simple carbohydrates to help decrease the risk of diabetes. Kimberly Orr agreed to follow-up with [place] as directed to closely monitor her progress.    -Refill metFORMIN (GLUCOPHAGE) 500 MG tablet; Take 1 tablet (500 mg total) by mouth in the morning and at bedtime.  Dispense: 60 tablet; Refill: 0  Orders Placed This Encounter  Procedures  . VITAMIN D 25 Hydroxy (Vit-D Deficiency, Fractures)  . Hemoglobin A1c  . Insulin, random  . Vitamin B12  . Folate  . Iron and TIBC  . Ferritin  . Transferrin     2. Other iron deficiency anemia Check anemia panel today.  Continue iron supplement  and use MiraLAX as needed.  Orders and follow up as documented in patient record.  Counseling . Iron is essential for our bodies to make red blood cells.  Reasons that someone may be deficient include: an iron-deficient diet (more likely in those following vegan or vegetarian diets), women with heavy menses, patients with GI disorders or poor absorption, patients that have had bariatric surgery, frequent blood donors, patients with cancer, and patients with heart disease.   Kimberly Orr foods include dark leafy greens, red and white meats, eggs, seafood, and beans.   . Certain foods and drinks prevent your body from absorbing iron properly. Avoid eating these foods in the same meal as iron-rich foods or with iron supplements. These foods include: coffee, black tea, and red wine; milk, dairy products, and foods that are high in calcium; beans and soybeans; whole grains.  . Constipation can be a side effect of iron supplementation. Increased water and fiber intake are helpful. Water goal: > 2 liters/day. Fiber goal: > 25 grams/day.   Orders Placed This Encounter  Procedures  . VITAMIN D 25 Hydroxy (Vit-D Deficiency, Fractures)  . Hemoglobin A1c  . Insulin, random  . Vitamin B12  . Folate  . Iron and TIBC  . Ferritin  . Transferrin    3. Vitamin D deficiency - Discussed importance of vitamin D (as well as calcium) to their health and well-being.   - We reviewed possible symptoms of low Vitamin D including low energy, depressed mood, muscle aches, joint aches, osteoporosis etc.  - We discussed that low Vitamin D levels may be linked to an increased risk of cardiovascular events and even increased risk of cancers- such as colon and breast.   - Informed patient this may be a lifelong thing, and we will need to monitor levels regularly to keep within normal limits.   - Educated pt that weight loss will likely improve availability of vitamin D, thus encouraged Kimberly Orr to continue with meal plan  and their weight loss efforts to further improve this condition   -Refill Vitamin D, Ergocalciferol, (DRISDOL) 1.25 MG (50000 UNIT) CAPS capsule; Take 1 capsule (50,000 Units total) by mouth every 7 (seven) days.  Dispense: 4 capsule; Refill: 0 - VITAMIN D 25 Hydroxy (Vit-D Deficiency, Fractures)   Orders Placed This Encounter  Procedures  . VITAMIN D 25 Hydroxy (Vit-D Deficiency, Fractures)  . Hemoglobin A1c  . Insulin, random  . Vitamin B12  . Folate  . Iron and TIBC  . Ferritin  . Transferrin    5. Class 1 obesity with serious comorbidity and body mass index (BMI) of 31.0 to 31.9 in adult, unspecified obesity type  Kimberly Orr is currently in the action stage of change. As such, her goal is to continue with weight loss efforts. She has agreed to  the Category 2 Plan. Gave recipes to him today.  Exercise goals: For substantial health benefits, adults should do at least 150 minutes (2 hours and 30 minutes) a week of moderate-intensity, or 75 minutes (1 hour and 15 minutes) a week of vigorous-intensity aerobic physical activity, or an equivalent combination of moderate- and vigorous-intensity aerobic activity. Aerobic activity should be performed in episodes of at least 10 minutes, and preferably, it should be spread throughout the week.  Behavioral modification strategies: increasing lean protein intake, no skipping meals, meal planning and cooking strategies and planning for success.  Kimberly Orr has agreed to follow-up with our clinic in 2 weeks. She was informed of the importance of frequent follow-up visits to maximize her success with intensive lifestyle modifications for her multiple health conditions.   Objective:   Blood pressure (!) 118/7, pulse 62, temperature 98.1 F (36.7 C), height 5\' 5"  (1.651 m), weight 191 lb (86.6 kg), SpO2 100 %. Body mass index is 31.78 kg/m.  General: Cooperative, alert, well developed, in no acute distress. HEENT: Conjunctivae and lids  unremarkable. Cardiovascular: Regular rhythm.  Lungs: Normal work of breathing. Neurologic: No focal deficits.   Lab Results  Component Value Date   CREATININE 0.87 04/15/2020   BUN 10 04/15/2020   NA 142 04/15/2020   K 4.1 04/15/2020   CL 107 04/15/2020   CO2 22 04/15/2020   Lab Results  Component Value Date   ALT 12 04/15/2020   AST 13 04/15/2020   ALKPHOS 83 04/15/2020   BILITOT 0.5 04/15/2020   Lab Results  Component Value Date   HGBA1C 5.5 04/20/2020   Lab Results  Component Value Date   INSULIN 14.4 04/20/2020   Lab Results  Component Value Date   TSH 2.100 04/20/2020   Lab Results  Component Value Date   CHOL 143 04/15/2020   HDL 53.90 04/15/2020   LDLCALC 78 04/15/2020   TRIG 53.0 04/15/2020   CHOLHDL 3 04/15/2020   Lab Results  Component Value Date   WBC 8.4 04/15/2020   HGB 13.6 04/15/2020   HCT 41.9 04/15/2020   MCV 83.5 04/15/2020   PLT 271.0 04/15/2020   Lab Results  Component Value Date   IRON 58 04/15/2020   FERRITIN 12.3 04/15/2020   Attestation Statements:   Reviewed by clinician on day of visit: allergies, medications, problem list, medical history, surgical history, family history, social history, and previous encounter notes.  I, Water quality scientist, CMA, am acting as Location manager for [employer], DO.  I have reviewed the above documentation for accuracy and completeness, and I agree with the above. Mellody Dance, DO

## 2020-07-26 LAB — IRON AND TIBC
Iron Saturation: 9 % — CL (ref 15–55)
Iron: 34 ug/dL (ref 27–159)
Total Iron Binding Capacity: 367 ug/dL (ref 250–450)
UIBC: 333 ug/dL (ref 131–425)

## 2020-07-26 LAB — HEMOGLOBIN A1C
Est. average glucose Bld gHb Est-mCnc: 111 mg/dL
Hgb A1c MFr Bld: 5.5 % (ref 4.8–5.6)

## 2020-07-26 LAB — TRANSFERRIN: Transferrin: 308 mg/dL (ref 192–364)

## 2020-07-26 LAB — FERRITIN: Ferritin: 27 ng/mL (ref 15–150)

## 2020-07-26 LAB — FOLATE: Folate: 11 ng/mL (ref >3.0)

## 2020-07-26 LAB — VITAMIN D 25 HYDROXY (VIT D DEFICIENCY, FRACTURES): Vit D, 25-Hydroxy: 53.6 ng/mL (ref 30.0–100.0)

## 2020-07-26 LAB — VITAMIN B12: Vitamin B-12: 463 pg/mL (ref 232–1245)

## 2020-07-26 LAB — INSULIN, RANDOM: INSULIN: 11.3 u[IU]/mL (ref 2.6–24.9)

## 2020-07-28 MED FILL — VIT D2 1.25 MG (50,000 UNIT: 1.25 MG | 28 days supply | Qty: 4 | Fill #0

## 2020-08-17 ENCOUNTER — Other Ambulatory Visit: Payer: Self-pay

## 2020-08-17 ENCOUNTER — Encounter (INDEPENDENT_AMBULATORY_CARE_PROVIDER_SITE_OTHER): Payer: Self-pay | Admitting: Family Medicine

## 2020-08-17 ENCOUNTER — Ambulatory Visit (INDEPENDENT_AMBULATORY_CARE_PROVIDER_SITE_OTHER): Payer: 59 | Admitting: Family Medicine

## 2020-08-17 VITALS — BP 109/71 | HR 72 | Temp 97.9°F | Ht 65.0 in | Wt 191.0 lb

## 2020-08-17 DIAGNOSIS — F3289 Other specified depressive episodes: Secondary | ICD-10-CM | POA: Diagnosis not present

## 2020-08-17 DIAGNOSIS — Z6831 Body mass index (BMI) 31.0-31.9, adult: Secondary | ICD-10-CM

## 2020-08-17 DIAGNOSIS — D508 Other iron deficiency anemias: Secondary | ICD-10-CM

## 2020-08-17 DIAGNOSIS — E669 Obesity, unspecified: Secondary | ICD-10-CM | POA: Diagnosis not present

## 2020-08-17 DIAGNOSIS — E559 Vitamin D deficiency, unspecified: Secondary | ICD-10-CM | POA: Diagnosis not present

## 2020-08-17 DIAGNOSIS — Z9189 Other specified personal risk factors, not elsewhere classified: Secondary | ICD-10-CM

## 2020-08-17 DIAGNOSIS — E8881 Metabolic syndrome: Secondary | ICD-10-CM

## 2020-08-17 MED ORDER — VITAMIN D (ERGOCALCIFEROL) 1.25 MG (50000 UNIT) PO CAPS: 50000.0000 [IU] | ORAL_CAPSULE | ORAL | 0 refills | Status: DC

## 2020-08-17 MED ORDER — METFORMIN HCL 500 MG PO TABS: 500.0000 mg | ORAL_TABLET | Freq: Two times a day (BID) | ORAL | 0 refills | Status: DC

## 2020-08-17 MED FILL — VIT D2 1.25 MG (50,000 UNIT: 1.25 MG | 28 days supply | Qty: 4 | Fill #0

## 2020-08-18 MED FILL — METFORMIN HCL 500 MG TABS: 500 | 30 days supply | Qty: 60 | Fill #0

## 2020-08-18 NOTE — Progress Notes (Signed)
Chief Complaint:   OBESITY Kimberly Orr is here to discuss her progress with her obesity treatment plan along with follow-up of her obesity related diagnoses. Kimberly Orr is on the Category 2 Plan and states she is following her eating plan approximately 70% of the time. Kimberly Orr states she is doing [employer] for 45 minutes 3 times per week.  Today's visit was #: 8 Starting weight: 191 lbs Starting date: 04/20/2020 Today's weight: 191 lbs Today's date: 08/17/2020 Total lbs lost to date: 0 Total lbs lost since last in-office visit: 0  Interim History: Kimberly Orr went on vacation to [place] for a week.  They are now in the midst of moving into a new home.  She says her kitchen is not set up yet.  However, she has been getting over 12,000 steps per day over the last couple of weeks.  Plan:  Use Intuitive Eating workbook to help with mood/ food relationship and keep on track.   Meditation for mindful eating recommended as well  Assessment/Plan:   1. Insulin resistance Discussed labs with patient today.  Kimberly Orr has a diagnosis of insulin resistance based on her elevated fasting insulin level >5. She continues to work on diet and exercise to decrease her risk of diabetes.  She is tolerating metformin well.  She feels a little sick if she eats too many carbs.  She is taking metformin daily.  Plan:  Will refill metformin today, as per below.  Lab Results  Component Value Date   INSULIN 11.3 07/25/2020   INSULIN 14.4 04/20/2020   Lab Results  Component Value Date   HGBA1C 5.5 07/25/2020   -Refill metFORMIN (GLUCOPHAGE) 500 MG tablet; Take 1 tablet (500 mg total) by mouth in the morning and at bedtime.  Dispense: 60 tablet; Refill: 0    2. Vitamin D deficiency Discussed labs with patient today.  Kimberly Orr has a history of Vitamin D deficiency with resultant generalized fatigue as her primary symptom.  she is taking vitamin D 50,000 IU weekly for this deficiency and tolerating it well  without side-effect.  Most recent Vitamin D lab reviewed-  level: 53.6 on 07/25/2020.  Plan:  - Discussed importance of vitamin D (as well as calcium) to their health and wellbeing.  - I recommend pt take weekly prescription vit D 50,000 IU - see script below- which pt agrees to     - Informed patient this may be a lifelong thing, and she was encouraged to continue to take the medicine until told otherwise.  We will need to monitor levels regularly (every 3-4 mo on average) to keep levels within normal limits.  -Refill Vitamin D, Ergocalciferol, (DRISDOL) 1.25 MG (50000 UNIT) CAPS capsule; Take 1 capsule (50,000 Units total) by mouth every 7 (seven) days.  Dispense: 4 capsule; Refill: 0    3. Other iron deficiency anemia Discussed labs with patient today.  Energy levels have improved some since starting iron and vitamin D supplements.  Plan:  Make sure to take iron and vitamin D daily.  CBC Latest Ref Rng & Units 04/15/2020 04/15/2019 02/03/2018  WBC 4.0 - 10.5 K/uL 8.4 8.1 9.5  Hemoglobin 12.0 - 15.0 g/dL 13.6 13.2 13.6  Hematocrit 36 - 46 % 41.9 40.8 41.2  Platelets 150 - 400 K/uL 271.0 345.0 313.0   Lab Results  Component Value Date   IRON 34 07/25/2020   TIBC 367 07/25/2020   FERRITIN 27 07/25/2020   Lab Results  Component Value Date   VITAMINB12  463 07/25/2020    4. Other depression, with emotional eating Discussed labs with patient today.  She went to see Dr. Mallie Mussel, but is not going any longer.  Plan:  Use Intuitive Eating workbook daily.   5. At risk for diabetes mellitus - Kimberly Orr was given extensive diabetes prevention education and counseling today of more than 8 minutes.  - Counseled patient on pathophysiology of disease and discussed various treatment options which always includes dietary and lifestyle modification as first line.   - Importance of healthy diet with very limited amounts of simple carbohydrates discussed with patient in addition to regular aerobic  exercise to an eventual goal of 29min 5d/week or more.  - Handouts provided at patient's desire and or told to go online at the American Diabetes Association website for further information    6. Class 1 obesity with serious comorbidity and body mass index (BMI) of 31.0 to 31.9 in adult, unspecified obesity type  Kimberly Orr is currently in the action stage of change. As such, her goal is to continue with weight loss efforts. She has agreed to the Category 2 Plan.   Exercise goals: Increase to 5 days per week.  Behavioral modification strategies: increasing lean protein intake, meal planning and cooking strategies, keeping healthy foods in the home and planning for success.  Kimberly Orr has agreed to follow-up with our clinic in 2 weeks. She was informed of the importance of frequent follow-up visits to maximize her success with intensive lifestyle modifications for her multiple health conditions.   Objective:   Blood pressure 109/71, pulse 72, temperature 97.9 F (36.6 C), height 5\' 5"  (1.651 m), weight 191 lb (86.6 kg), SpO2 98 %. Body mass index is 31.78 kg/m.  General: Cooperative, alert, well developed, in no acute distress. HEENT: Conjunctivae and lids unremarkable. Cardiovascular: Regular rhythm.  Lungs: Normal work of breathing. Neurologic: No focal deficits.   Lab Results  Component Value Date   CREATININE 0.87 04/15/2020   BUN 10 04/15/2020   NA 142 04/15/2020   K 4.1 04/15/2020   CL 107 04/15/2020   CO2 22 04/15/2020   Lab Results  Component Value Date   ALT 12 04/15/2020   AST 13 04/15/2020   ALKPHOS 83 04/15/2020   BILITOT 0.5 04/15/2020   Lab Results  Component Value Date   HGBA1C 5.5 07/25/2020   HGBA1C 5.5 04/20/2020   Lab Results  Component Value Date   INSULIN 11.3 07/25/2020   INSULIN 14.4 04/20/2020   Lab Results  Component Value Date   TSH 2.100 04/20/2020   Lab Results  Component Value Date   CHOL 143 04/15/2020   HDL 53.90 04/15/2020    LDLCALC 78 04/15/2020   TRIG 53.0 04/15/2020   CHOLHDL 3 04/15/2020   Lab Results  Component Value Date   WBC 8.4 04/15/2020   HGB 13.6 04/15/2020   HCT 41.9 04/15/2020   MCV 83.5 04/15/2020   PLT 271.0 04/15/2020   Lab Results  Component Value Date   IRON 34 07/25/2020   TIBC 367 07/25/2020   FERRITIN 27 07/25/2020   Attestation Statements:   Reviewed by clinician on day of visit: allergies, medications, problem list, medical history, surgical history, family history, social history, and previous encounter notes.  I, Water quality scientist, CMA, am acting as Location manager for [employer], DO.  I have reviewed the above documentation for accuracy and completeness, and I agree with the above. Marjory Sneddon, D.O.  The 21st Century Cures Act was signed into  law in 2016 which includes the topic of electronic health records.  This provides immediate access to information in MyChart.  This includes consultation notes, operative notes, office notes, lab results and pathology reports.  If you have any questions about what you read please let us know at your next visit so we can discuss your concerns and take corrective action if need be.  We are right here with you.

## 2020-08-29 MED FILL — VIT D2 1.25 MG (50,000 UNIT: 1.25 MG | 28 days supply | Qty: 4 | Fill #0

## 2020-08-31 ENCOUNTER — Encounter (INDEPENDENT_AMBULATORY_CARE_PROVIDER_SITE_OTHER): Payer: Self-pay | Admitting: Family Medicine

## 2020-08-31 ENCOUNTER — Ambulatory Visit (INDEPENDENT_AMBULATORY_CARE_PROVIDER_SITE_OTHER): Payer: 59 | Admitting: Family Medicine

## 2020-08-31 ENCOUNTER — Other Ambulatory Visit (INDEPENDENT_AMBULATORY_CARE_PROVIDER_SITE_OTHER): Payer: Self-pay | Admitting: Family Medicine

## 2020-08-31 ENCOUNTER — Other Ambulatory Visit: Payer: Self-pay

## 2020-08-31 VITALS — BP 113/77 | HR 62 | Temp 97.7°F | Ht 65.0 in | Wt 190.0 lb

## 2020-08-31 DIAGNOSIS — E559 Vitamin D deficiency, unspecified: Secondary | ICD-10-CM | POA: Insufficient documentation

## 2020-08-31 DIAGNOSIS — E669 Obesity, unspecified: Secondary | ICD-10-CM

## 2020-08-31 DIAGNOSIS — Z9189 Other specified personal risk factors, not elsewhere classified: Secondary | ICD-10-CM | POA: Insufficient documentation

## 2020-08-31 DIAGNOSIS — E8881 Metabolic syndrome: Secondary | ICD-10-CM | POA: Diagnosis not present

## 2020-08-31 DIAGNOSIS — E611 Iron deficiency: Secondary | ICD-10-CM | POA: Diagnosis not present

## 2020-08-31 DIAGNOSIS — E88819 Insulin resistance, unspecified: Secondary | ICD-10-CM | POA: Insufficient documentation

## 2020-08-31 DIAGNOSIS — Z6831 Body mass index (BMI) 31.0-31.9, adult: Secondary | ICD-10-CM

## 2020-08-31 MED ORDER — VITAMIN D (ERGOCALCIFEROL) 1.25 MG (50000 UNIT) PO CAPS: 50000.0000 [IU] | ORAL_CAPSULE | ORAL | 0 refills | Status: DC

## 2020-08-31 MED ORDER — FERROUS SULFATE 325 (65 FE) MG PO TABS: 325.0000 mg | ORAL_TABLET | Freq: Every day | ORAL | 0 refills | Status: DC

## 2020-08-31 MED ORDER — METFORMIN HCL 500 MG PO TABS: 500.0000 mg | ORAL_TABLET | Freq: Two times a day (BID) | ORAL | 0 refills | Status: DC

## 2020-08-31 MED FILL — METFORMIN HCL 500 MG TABS: 500 | 30 days supply | Qty: 60 | Fill #0

## 2020-08-31 MED FILL — FERROUS SULFATE 325 MG TAB: 325 (65 FE) | 100 days supply | Qty: 100 | Fill #0

## 2020-09-06 ENCOUNTER — Other Ambulatory Visit: Payer: Self-pay | Admitting: Advanced Practice Midwife

## 2020-09-06 DIAGNOSIS — B373 Candidiasis of vulva and vagina: Secondary | ICD-10-CM

## 2020-09-06 DIAGNOSIS — B3731 Acute candidiasis of vulva and vagina: Secondary | ICD-10-CM

## 2020-09-06 MED ORDER — FLUCONAZOLE 150 MG PO TABS: 150.0000 mg | ORAL_TABLET | ORAL | 3 refills | Status: DC | PRN

## 2020-09-06 MED FILL — FLUCONAZOLE 150 MG TABS: 150 | 3 days supply | Qty: 3 | Fill #0

## 2020-09-06 NOTE — Progress Notes (Signed)
Chief Complaint:   OBESITY Kimberly Orr is here to discuss her progress with her obesity treatment plan along with follow-up of her obesity related diagnoses. Kimberly Orr is on the Category 2 Plan and states she is following her eating plan approximately 85% of the time. Kimberly Orr states she is doing [employer] for 45 minutes 3 times per week.  Today's visit was #: 9 Starting weight: 191 lbs Starting date: 04/20/2020 Today's weight: 190 lbs Today's date: 08/31/2020 Total lbs lost to date: 1 lb Total lbs lost since last in-office visit: 1 lb Total weight loss percentage to date: -0.52%  Interim History: Kimberly Orr says that over the last 2 weeks, she moved into her home and now has a kitchen.  Things are starting to get back to normal.  She felt she was on plan this past week.  Hunger and cravings are well controlled.  Assessment/Plan:     1. Insulin resistance Kimberly Orr has a diagnosis of insulin resistance based on her elevated fasting insulin level >5. She continues to work on diet and exercise to decrease her risk of diabetes.  She is taking metformin 500 mg twice daily and tolerating it well.  She says she feels it helps her hunger and cravings.  Denies need for a refill.  Plan:  Refill metformin today, as per below.  Lab Results  Component Value Date   INSULIN 11.3 07/25/2020   INSULIN 14.4 04/20/2020   Lab Results  Component Value Date   HGBA1C 5.5 07/25/2020   -Refill metFORMIN (GLUCOPHAGE) 500 MG tablet; Take 1 tablet (500 mg total) by mouth in the morning and at bedtime.  Dispense: 60 tablet; Refill: 0    2. Iron deficiency Kimberly Orr is taking an iron supplement.  No constipation or issues with medication.  Plan:  Refill ferrous sulfate, as per below.   CBC Latest Ref Rng & Units 04/15/2020 04/15/2019 02/03/2018  WBC 4.0 - 10.5 K/uL 8.4 8.1 9.5  Hemoglobin 12.0 - 15.0 g/dL 13.6 13.2 13.6  Hematocrit 36 - 46 % 41.9 40.8 41.2  Platelets 150 - 400 K/uL 271.0 345.0  313.0   Lab Results  Component Value Date   IRON 34 07/25/2020   TIBC 367 07/25/2020   FERRITIN 27 07/25/2020   Lab Results  Component Value Date   VITAMINB12 463 07/25/2020   -Refill ferrous sulfate 325 (65 FE) MG tablet; Take 1 tablet (325 mg total) by mouth daily with breakfast.  Dispense: 30 tablet; Refill: 0    3. Vitamin D deficiency Kimberly Orr's Vitamin D level was 53.6 on 07/25/2020. She is currently taking prescription vitamin D 50,000 IU each week. She denies nausea, vomiting or muscle weakness.  Tolerating well.  Plan:  Recheck vitamin D levels around February 4th or so.  Refill vitamin D today, as per below.  -Refill Vitamin D, Ergocalciferol, (DRISDOL) 1.25 MG (50000 UNIT) CAPS capsule; Take 1 capsule (50,000 Units total) by mouth every 7 (seven) days.  Dispense: 4 capsule; Refill: 0    4. At risk for impaired metabolic function Due to Kimberly Orr's current state of health and medical condition(s), they are at a significantly higher risk for impaired metabolic function.  This further also puts the patient at much greater risk to also subsequently develop cardiopulmonary conditions that can negatively affect patient's quality of life as well.  At least 15 minutes was spent on counseling Kimberly Orr about these concerns today and I stressed the importance of reversing these risks factors.  Initial goal is to  lose at least 5-10% of starting weight to help reduce risk factors.  Counseling: Intensive lifestyle modifications discussed with Kimberly Orr as most appropriate first line treatment.  she will continue to work on diet, exercise and weight loss efforts.  We will continue to reassess these conditions on a fairly regular basis in an attempt to decrease patient's overall morbidity and mortality.    5. Class 1 obesity with serious comorbidity and body mass index (BMI) of 31.0 to 31.9 in adult, unspecified obesity type  Kimberly Orr is currently in the action stage of change. As such, her  goal is to continue with weight loss efforts. She has agreed to the Category 2 Plan.   Exercise goals: Increase to 5 days per week if possible.  Behavioral modification strategies: meal planning and cooking strategies, holiday eating strategies , celebration eating strategies and planning for success.  Kimberly Orr has agreed to follow-up with our clinic in 2-3 weeks. She was informed of the importance of frequent follow-up visits to maximize her success with intensive lifestyle modifications for her multiple health conditions.   Objective:   Blood pressure 113/77, pulse 62, temperature 97.7 F (36.5 C), height 5\' 5"  (1.651 m), weight 190 lb (86.2 kg), SpO2 97 %. Body mass index is 31.62 kg/m.  General: Cooperative, alert, well developed, in no acute distress. HEENT: Conjunctivae and lids unremarkable. Cardiovascular: Regular rhythm.  Lungs: Normal work of breathing. Neurologic: No focal deficits.   Lab Results  Component Value Date   CREATININE 0.87 04/15/2020   BUN 10 04/15/2020   NA 142 04/15/2020   K 4.1 04/15/2020   CL 107 04/15/2020   CO2 22 04/15/2020   Lab Results  Component Value Date   ALT 12 04/15/2020   AST 13 04/15/2020   ALKPHOS 83 04/15/2020   BILITOT 0.5 04/15/2020   Lab Results  Component Value Date   HGBA1C 5.5 07/25/2020   HGBA1C 5.5 04/20/2020   Lab Results  Component Value Date   INSULIN 11.3 07/25/2020   INSULIN 14.4 04/20/2020   Lab Results  Component Value Date   TSH 2.100 04/20/2020   Lab Results  Component Value Date   CHOL 143 04/15/2020   HDL 53.90 04/15/2020   LDLCALC 78 04/15/2020   TRIG 53.0 04/15/2020   CHOLHDL 3 04/15/2020   Lab Results  Component Value Date   WBC 8.4 04/15/2020   HGB 13.6 04/15/2020   HCT 41.9 04/15/2020   MCV 83.5 04/15/2020   PLT 271.0 04/15/2020   Lab Results  Component Value Date   IRON 34 07/25/2020   TIBC 367 07/25/2020   FERRITIN 27 07/25/2020   Attestation Statements:   Reviewed by  clinician on day of visit: allergies, medications, problem list, medical history, surgical history, family history, social history, and previous encounter notes.  I, Water quality scientist, CMA, am acting as Location manager for [employer], DO.  I have reviewed the above documentation for accuracy and completeness, and I agree with the above. -  *Marjory Sneddon, D.O.  The [place] was signed into law in 2016 which includes the topic of electronic health records.  This provides immediate access to information in MyChart.  This includes consultation notes, operative notes, office notes, lab results and pathology reports.  If you have any questions about what you read please let us know at your next visit so we can discuss your concerns and take corrective action if need be.  We are right here with you.

## 2020-09-06 NOTE — Progress Notes (Signed)
Feels symptoms of yeast vaginitis Has had it before Will Rx Diflucan

## 2020-09-19 ENCOUNTER — Ambulatory Visit (INDEPENDENT_AMBULATORY_CARE_PROVIDER_SITE_OTHER): Payer: 59 | Admitting: Family Medicine

## 2020-09-27 ENCOUNTER — Other Ambulatory Visit: Payer: Self-pay

## 2020-09-27 ENCOUNTER — Other Ambulatory Visit: Payer: Self-pay | Admitting: Family Medicine

## 2020-09-27 ENCOUNTER — Emergency Department (INDEPENDENT_AMBULATORY_CARE_PROVIDER_SITE_OTHER)
Admission: EM | Admit: 2020-09-27 | Discharge: 2020-09-27 | Disposition: A | Discharge status: Home / Self Care | Payer: 59 | Source: Home / Self Care | Attending: Family Medicine | Admitting: Family Medicine

## 2020-09-27 DIAGNOSIS — J029 Acute pharyngitis, unspecified: Secondary | ICD-10-CM

## 2020-09-27 DIAGNOSIS — Z20818 Contact with and (suspected) exposure to other bacterial communicable diseases: Secondary | ICD-10-CM

## 2020-09-27 MED ORDER — PENICILLIN V POTASSIUM 500 MG PO TABS: 500.0000 mg | ORAL_TABLET | Freq: Two times a day (BID) | ORAL | 0 refills | Status: DC

## 2020-09-27 NOTE — ED Triage Notes (Signed)
Patient states she had a sore throat that began this morning as well as right ear pain. Pt states her son had a positive strep test at his pediatricians today. Pt is aox4 and ambulatory.

## 2020-09-27 NOTE — ED Provider Notes (Signed)
[name]    CSN: [id number] Arrival date & time: 09/27/20  1828      History   Chief Complaint Chief Complaint  Patient presents with  . Sore Throat    since this am  . Otalgia    right ear since this am    HPI Kimberly Orr is a 34 y.o. female.   HPI  Patient and her son both have sore throat.  The son was diagnosed as having strep throat by positive strep test at his pediatrician today.  Patient is here for evaluation.  She works as a Marine scientist at [employer].  She has no fever or chills.  She is having some ear pressure and pain.  She has had her Covid vaccinations  Past Medical History:  Diagnosis Date  . Anemia   . Basal cell carcinoma   . Depression   . GERD (gastroesophageal reflux disease)   . History of chicken pox   . Joint pain   . Weight gain     Patient Active Problem List   Diagnosis Date Noted  . Insulin resistance 08/31/2020  . Iron deficiency 08/31/2020  . Vitamin D deficiency 08/31/2020  . At risk for impaired metabolic function 08/15/8526  . Class 1 obesity without serious comorbidity with body mass index (BMI) of 32.0 to 32.9 in adult 04/05/2020  . Well adult exam 01/13/2018  . Weight gain 09/10/2017  . Hair loss 09/10/2017    Past Surgical History:  Procedure Laterality Date  . DILATION AND CURETTAGE OF UTERUS  2011  . WISDOM TOOTH EXTRACTION      OB History    Gravida  2   Para  2   Term  2   Preterm      AB      Living  2     SAB      TAB      Ectopic      Multiple      Live Births               Home Medications    Prior to Admission medications   Medication Sig Start Date End Date Taking? Authorizing Provider  ferrous sulfate 325 (65 FE) MG tablet Take 1 tablet (325 mg total) by mouth daily with breakfast. 08/31/20  Yes Opalski, Neoma Laming, DO  metFORMIN (GLUCOPHAGE) 500 MG tablet Take 1 tablet (500 mg total) by mouth in the morning and at bedtime. 08/31/20  Yes Opalski, Deborah, DO   Multiple Vitamins-Minerals (WOMENS MULTIVITAMIN PO)    Yes [provider]  Vitamin D, Ergocalciferol, (DRISDOL) 1.25 MG (50000 UNIT) CAPS capsule Take 1 capsule (50,000 Units total) by mouth every 7 (seven) days. 08/31/20  Yes Opalski, Neoma Laming, DO  fluconazole (DIFLUCAN) 150 MG tablet Take 1 tablet (150 mg total) by mouth as needed (yeast vaginitis). 09/06/20   Seabron Spates, CNM  penicillin v potassium (VEETID) 500 MG tablet Take 1 tablet (500 mg total) by mouth 2 (two) times daily for 10 days. 09/27/20 10/07/20  Raylene Everts, MD    Family History Family History  Problem Relation Age of Onset  . Skin cancer Other        mother and father   . Heart attack Maternal Grandfather   . Hypertension Maternal Grandfather   . Heart disease Maternal Grandfather   . Cancer Mother        pre-cancerous skin  . Cancer Father        precancerous  skin  . Hyperlipidemia Father   . Parkinson's disease Maternal Grandmother   . Cancer Paternal Grandmother        breast cancer  . Breast cancer Paternal Grandmother   . Cancer Paternal Grandfather        prostate cancer    Social History Social History   Tobacco Use  . Smoking status: Never Smoker  . Smokeless tobacco: Never Used  Vaping Use  . Vaping Use: Never used  Substance Use Topics  . Alcohol use: No  . Drug use: No     Allergies   Patient has no known allergies.   Review of Systems Review of Systems See HPI  Physical Exam Triage Vital Signs ED Triage Vitals  Enc Vitals Group     BP 09/27/20 1841 114/83     Pulse Rate 09/27/20 1841 86     Resp 09/27/20 1841 18     Temp 09/27/20 1841 98.6 F (37 C)     Temp Source 09/27/20 1841 Oral     SpO2 09/27/20 1841 98 %     Weight --      Height --      Head Circumference --      Peak Flow --      Pain Score 09/27/20 1844 7     Pain Loc --      Pain Edu? --      Excl. in [place]? --    No data found.  Updated Vital Signs BP 114/83 (BP Location: Left Arm)    Pulse 86   Temp 98.6 F (37 C) (Oral)   Resp 18   LMP 09/14/2020 (Exact Date)   SpO2 98%      Physical Exam Constitutional:      General: She is not in acute distress.    Appearance: She is well-developed.  HENT:     Head: Normocephalic and atraumatic.     Mouth/Throat:     Mouth: Mucous membranes are moist.     Pharynx: Posterior oropharyngeal erythema present.     Tonsils: No tonsillar exudate or tonsillar abscesses. 1+ on the right. 1+ on the left.  Eyes:     Conjunctiva/sclera: Conjunctivae normal.     Pupils: Pupils are equal, round, and reactive to light.  Cardiovascular:     Rate and Rhythm: Normal rate and regular rhythm.     Heart sounds: Normal heart sounds.  Pulmonary:     Effort: Pulmonary effort is normal. No respiratory distress.     Breath sounds: Normal breath sounds.  Abdominal:     General: There is no distension.     Palpations: Abdomen is soft.  Musculoskeletal:        General: Normal range of motion.     Cervical back: Normal range of motion.  Skin:    General: Skin is warm and dry.  Neurological:     Mental Status: She is alert.  Psychiatric:        Behavior: Behavior normal.      UC Treatments / Results  Labs (all labs ordered are listed, but only abnormal results are displayed) Labs Reviewed  STREP A DNA PROBE  POCT RAPID STREP A (OFFICE)    EKG   Radiology No results found.  Procedures Procedures (including critical care time)  Medications Ordered in UC Medications - No data to display  Initial Impression / Assessment and Plan / UC Course  I have reviewed the triage vital signs and the nursing notes.  Pertinent  labs & imaging results that were available during my care of the patient were reviewed by me and considered in my medical decision making (see chart for details).    Rapid test is negative.  Patient is still being treated because of her close strep exposure. Final Clinical Impressions(s) / UC Diagnoses   Final  diagnoses:  Strep throat exposure  Sore throat  Acute pharyngitis, unspecified etiology     Discharge Instructions     Take antibiotic 2 times a day for 10 full days Drink plenty of fluids Tylenol or ibuprofen for pain and fever Salt water gargles Stay off work tomorrow   ED Prescriptions    Medication Sig Dispense Auth. Provider   penicillin v potassium (VEETID) 500 MG tablet Take 1 tablet (500 mg total) by mouth 2 (two) times daily for 10 days. 20 tablet Raylene Everts, MD     PDMP not reviewed this encounter.   Raylene Everts, MD 09/27/20 [phone]

## 2020-09-27 NOTE — Discharge Instructions (Addendum)
Take antibiotic 2 times a day for 10 full days Drink plenty of fluids Tylenol or ibuprofen for pain and fever Salt water gargles Stay off work tomorrow

## 2020-09-28 LAB — STREP A DNA PROBE: Group A Strep Probe: NOT DETECTED

## 2020-09-28 MED FILL — PENICILLIN VK 500 MG TABLET: 500 | 10 days supply | Qty: 20 | Fill #0

## 2020-10-10 ENCOUNTER — Encounter (INDEPENDENT_AMBULATORY_CARE_PROVIDER_SITE_OTHER): Payer: Self-pay | Admitting: Family Medicine

## 2020-10-10 ENCOUNTER — Other Ambulatory Visit: Payer: Self-pay

## 2020-10-10 ENCOUNTER — Ambulatory Visit (INDEPENDENT_AMBULATORY_CARE_PROVIDER_SITE_OTHER): Payer: 59 | Admitting: Family Medicine

## 2020-10-10 ENCOUNTER — Other Ambulatory Visit (INDEPENDENT_AMBULATORY_CARE_PROVIDER_SITE_OTHER): Payer: Self-pay | Admitting: Family Medicine

## 2020-10-10 VITALS — BP 115/70 | HR 72 | Temp 97.6°F | Ht 65.0 in | Wt 193.0 lb

## 2020-10-10 DIAGNOSIS — E611 Iron deficiency: Secondary | ICD-10-CM

## 2020-10-10 DIAGNOSIS — Z9189 Other specified personal risk factors, not elsewhere classified: Secondary | ICD-10-CM | POA: Diagnosis not present

## 2020-10-10 DIAGNOSIS — E8881 Metabolic syndrome: Secondary | ICD-10-CM | POA: Diagnosis not present

## 2020-10-10 DIAGNOSIS — Z6832 Body mass index (BMI) 32.0-32.9, adult: Secondary | ICD-10-CM | POA: Diagnosis not present

## 2020-10-10 DIAGNOSIS — E559 Vitamin D deficiency, unspecified: Secondary | ICD-10-CM

## 2020-10-10 DIAGNOSIS — E669 Obesity, unspecified: Secondary | ICD-10-CM

## 2020-10-10 MED ORDER — METFORMIN HCL 500 MG PO TABS: 500.0000 mg | ORAL_TABLET | Freq: Two times a day (BID) | ORAL | 0 refills | Status: DC

## 2020-10-10 MED ORDER — FERROUS SULFATE 325 (65 FE) MG PO TABS: 325.0000 mg | ORAL_TABLET | Freq: Every day | ORAL | 0 refills | Status: DC

## 2020-10-10 MED ORDER — VITAMIN D (ERGOCALCIFEROL) 1.25 MG (50000 UNIT) PO CAPS: 50000.0000 [IU] | ORAL_CAPSULE | ORAL | 0 refills | Status: DC

## 2020-10-10 MED FILL — METFORMIN HCL 500 MG TABS: 500 | 30 days supply | Qty: 60 | Fill #0

## 2020-10-10 MED FILL — VIT D2 1.25 MG (50,000 UNIT: 1.25 MG | 28 days supply | Qty: 4 | Fill #0

## 2020-10-11 NOTE — Progress Notes (Signed)
Chief Complaint:   OBESITY Kimberly Orr is here to discuss her progress with her obesity treatment plan along with follow-up of her obesity related diagnoses. Kimberly Orr is on the Category 2 Plan and states she is following her eating plan approximately 75-80% of the time. 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  Today's visit was #: 10 Starting weight: 191 lbs Starting date: 04/20/2020 Today's weight: 193 lbs Today's date: 10/10/2020 Total lbs lost to date: +3 lbs Total lbs lost since last in-office visit: +2 lbs Total weight loss percentage to date: 1.05%  Interim History: It has been 5-6 weeks since Kimberly Orr's last office visit. She was sick with a URI and life stressors occurred. She was not able to eat for several days and it wasn't until recently that she has started to feel like herself again.  Assessment/Plan:   1. Insulin resistance Kimberly Orr has a diagnosis of insulin resistance based on her elevated fasting insulin level >5. She continues to work on diet and exercise to decrease her risk of diabetes.  Lab Results  Component Value Date   INSULIN 11.3 07/25/2020   INSULIN 14.4 04/20/2020   Lab Results  Component Value Date   HGBA1C 5.5 07/25/2020   Plan: Refill Metformin for 1 month, as per below. Nirel will continue to work on weight loss, exercise, and decreasing simple carbohydrates to help decrease the risk of diabetes. Sande agreed to follow-up with [place] as directed to closely monitor her progress.  Refill- metFORMIN (GLUCOPHAGE) 500 MG tablet; Take 1 tablet (500 mg total) by mouth in the morning and at bedtime.  Dispense: 60 tablet; Refill: 0  2. Iron deficiency Kimberly Orr is not a vegetarian.  She does not have a history of weight loss surgery. She takes ferrous sulfate 325 mg.   CBC Latest Ref Rng & Units 04/15/2020 04/15/2019 02/03/2018  WBC 4.0 - 10.5 K/uL 8.4 8.1 9.5  Hemoglobin 12.0 - 15.0 g/dL 13.6 13.2 13.6  Hematocrit  36.0 - 46.0 % 41.9 40.8 41.2  Platelets 150.0 - 400.0 K/uL 271.0 345.0 313.0   Lab Results  Component Value Date   IRON 34 07/25/2020   TIBC 367 07/25/2020   FERRITIN 27 07/25/2020   Lab Results  Component Value Date   [id number] 737 07/25/2020   Plan: Refill ferrous sulfate for 1 month, as per below. Orders and follow up as documented in patient record.  Counseling . Iron is essential for our bodies to make red blood cells.  Reasons that someone may be deficient include: an iron-deficient diet (more likely in those following vegan or vegetarian diets), women with heavy menses, patients with GI disorders or poor absorption, patients that have had bariatric surgery, frequent blood donors, patients with cancer, and patients with heart disease.   Marland Kitchen An iron supplement has been recommended. This is found over-the-counter. We recommend: Once Daily: Slow Fe 45mg  Iron Supplement for Iron Deficiency, Slow Release, High Potency, Easy to Swallow Tablets..  . Iron-rich foods include dark leafy greens, red and white meats, eggs, seafood, and beans.   . Certain foods and drinks prevent your body from absorbing iron properly. Avoid eating these foods in the same meal as iron-rich foods or with iron supplements. These foods include: coffee, black tea, and red wine; milk, dairy products, and foods that are high in calcium; beans and soybeans; whole grains.  . Constipation can be a side effect of iron supplementation. Increased water and fiber intake  are helpful. Water goal: > 2 liters/day. Fiber goal: > 25 grams/day.  Refill- ferrous sulfate 325 (65 FE) MG tablet; Take 1 tablet (325 mg total) by mouth daily with breakfast.  Dispense: 30 tablet; Refill: 0  3. Vitamin D deficiency Kimberly Orr's Vitamin D level was 53.6 on 07/25/2020. She is currently taking prescription vitamin D 50,000 IU each week. She denies nausea, vomiting or muscle weakness.   Ref. Range 07/25/2020 08:22  Vitamin D, 25-Hydroxy Latest Ref  Range: 30.0 - 100.0 ng/mL 53.6   Plan: Refill Vit D for 1 month, as per below. Low Vitamin D level contributes to fatigue and are associated with obesity, breast, and colon cancer. She agrees to continue to take prescription Vitamin D @50 ,000 IU every week and will follow-up for routine testing of Vitamin D, at least 2-3 times per year to avoid over-replacement.  Refill- Vitamin D, Ergocalciferol, (DRISDOL) 1.25 MG (50000 UNIT) CAPS capsule; Take 1 capsule (50,000 Units total) by mouth every 7 (seven) days.  Dispense: 4 capsule; Refill: 0  4. At risk for impaired metabolic function Kimberly Orr was given approximately 15 minutes of impaired  metabolic function prevention counseling today. We discussed intensive lifestyle modifications today with an emphasis on specific nutrition and exercise instructions and strategies.   5. Class 1 obesity with serious comorbidity and body mass index (BMI) of 32.0 to 32.9 in adult, unspecified obesity type Kimberly Orr is currently in the action stage of change. As such, her goal is to continue with weight loss efforts. She has agreed to the Category 2 Plan.   Exercise goals: For substantial health benefits, adults should do at least 150 minutes (2 hours and 30 minutes) a week of moderate-intensity, or 75 minutes (1 hour and 15 minutes) a week of vigorous-intensity aerobic physical activity, or an equivalent combination of moderate- and vigorous-intensity aerobic activity. Aerobic activity should be performed in episodes of at least 10 minutes, and preferably, it should be spread throughout the week.  Behavioral modification strategies: meal planning and cooking strategies, holiday eating strategies  and celebration eating strategies.  Kimberly Orr has agreed to follow-up with our clinic in 2-2.5 weeks. She was informed of the importance of frequent follow-up visits to maximize her success with intensive lifestyle modifications for her multiple health conditions.   Objective:    Blood pressure 115/70, pulse 72, temperature 97.6 F (36.4 C), height 5\' 5"  (1.651 m), weight 193 lb (87.5 kg), last menstrual period 09/14/2020, SpO2 97 %. Body mass index is 32.12 kg/m.  General: Cooperative, alert, well developed, in no acute distress. HEENT: Conjunctivae and lids unremarkable. Cardiovascular: Regular rhythm.  Lungs: Normal work of breathing. Neurologic: No focal deficits.   Lab Results  Component Value Date   CREATININE 0.87 04/15/2020   BUN 10 04/15/2020   NA 142 04/15/2020   K 4.1 04/15/2020   CL 107 04/15/2020   CO2 22 04/15/2020   Lab Results  Component Value Date   ALT 12 04/15/2020   AST 13 04/15/2020   ALKPHOS 83 04/15/2020   BILITOT 0.5 04/15/2020   Lab Results  Component Value Date   HGBA1C 5.5 07/25/2020   HGBA1C 5.5 04/20/2020   Lab Results  Component Value Date   INSULIN 11.3 07/25/2020   INSULIN 14.4 04/20/2020   Lab Results  Component Value Date   TSH 2.100 04/20/2020   Lab Results  Component Value Date   CHOL 143 04/15/2020   HDL 53.90 04/15/2020   LDLCALC 78 04/15/2020   TRIG 53.0 04/15/2020  CHOLHDL 3 04/15/2020   Lab Results  Component Value Date   WBC 8.4 04/15/2020   HGB 13.6 04/15/2020   HCT 41.9 04/15/2020   MCV 83.5 04/15/2020   PLT 271.0 04/15/2020   Lab Results  Component Value Date   IRON 34 07/25/2020   TIBC 367 07/25/2020   FERRITIN 27 07/25/2020    Attestation Statements:   Reviewed by clinician on day of visit: allergies, medications, problem list, medical history, surgical history, family history, social history, and previous encounter notes.  Coral Ceo, am acting as Location manager for [employer], DO.  I have reviewed the above documentation for accuracy and completeness, and I agree with the above. Marjory Sneddon, D.O.  The [place] was signed into law in 2016 which includes the topic of electronic health records.  This provides immediate access to  information in MyChart.  This includes consultation notes, operative notes, office notes, lab results and pathology reports.  If you have any questions about what you read please let us know at your next visit so we can discuss your concerns and take corrective action if need be.  We are right here with you.

## 2020-10-31 ENCOUNTER — Ambulatory Visit (INDEPENDENT_AMBULATORY_CARE_PROVIDER_SITE_OTHER): Payer: 59 | Admitting: Family Medicine

## 2020-10-31 ENCOUNTER — Other Ambulatory Visit: Payer: Self-pay

## 2020-10-31 ENCOUNTER — Encounter (INDEPENDENT_AMBULATORY_CARE_PROVIDER_SITE_OTHER): Payer: Self-pay | Admitting: Family Medicine

## 2020-10-31 ENCOUNTER — Other Ambulatory Visit (INDEPENDENT_AMBULATORY_CARE_PROVIDER_SITE_OTHER): Payer: Self-pay | Admitting: Family Medicine

## 2020-10-31 VITALS — BP 106/72 | HR 62 | Temp 97.9°F | Ht 65.0 in | Wt 190.0 lb

## 2020-10-31 DIAGNOSIS — Z9189 Other specified personal risk factors, not elsewhere classified: Secondary | ICD-10-CM | POA: Diagnosis not present

## 2020-10-31 DIAGNOSIS — E669 Obesity, unspecified: Secondary | ICD-10-CM | POA: Diagnosis not present

## 2020-10-31 DIAGNOSIS — E559 Vitamin D deficiency, unspecified: Secondary | ICD-10-CM | POA: Diagnosis not present

## 2020-10-31 DIAGNOSIS — Z6831 Body mass index (BMI) 31.0-31.9, adult: Secondary | ICD-10-CM | POA: Diagnosis not present

## 2020-10-31 DIAGNOSIS — E8881 Metabolic syndrome: Secondary | ICD-10-CM | POA: Diagnosis not present

## 2020-10-31 MED ORDER — METFORMIN HCL 500 MG PO TABS: 500.0000 mg | ORAL_TABLET | Freq: Two times a day (BID) | ORAL | 0 refills | Status: DC

## 2020-10-31 MED ORDER — VITAMIN D (ERGOCALCIFEROL) 1.25 MG (50000 UNIT) PO CAPS: 50000.0000 [IU] | ORAL_CAPSULE | ORAL | 0 refills | Status: DC

## 2020-11-01 MED FILL — VIT D2 1.25 MG (50,000 UNIT: 1.25 MG | 28 days supply | Qty: 4 | Fill #0

## 2020-11-02 NOTE — Progress Notes (Signed)
Chief Complaint:   OBESITY Kimberly Orr is here to discuss her progress with her obesity treatment plan along with follow-up of her obesity related diagnoses. Kimberly Orr is on the Category 2 Plan and states she is following her eating plan approximately 85% of the time. Kimberly Orr states she is doing burn boot camp 45 minutes 3-4 times per week.  Today's visit was #: 11 Starting weight: 191 lbs Starting date: 04/20/2020 Today's weight: 190 lbs Today's date: 10/31/2020 Total lbs lost to date: 1 lb Total lbs lost since last in-office visit: 3 lbs Total weight loss percentage to date: -0.52%  Interim History: Kimberly Orr states after the holidays she started back exercising every day and is taking all medication as written.  15% of the time she eats off-plan foods.  Usually she doesn't skip meals.  No cravings or hunger issues   Assessment/Plan:   1. Insulin resistance Kimberly Orr has been more consistent with taking her Metformin recently and that has helped with sweet cravings.  Denies S-E of med or concerns  Plan: Refill Metformin for 1 month, as per below. Kimberly Orr will continue to work on weight loss, exercise, and decreasing simple carbohydrates to help decrease the risk of diabetes. Kimberly Orr agreed to follow-up with [place] as directed to closely monitor her progress.  Refill- metFORMIN (GLUCOPHAGE) 500 MG tablet; Take 1 tablet (500 mg total) by mouth in the morning and at bedtime.  Dispense: 60 tablet; Refill: 0    2. Vitamin D deficiency Kimberly Orr's Vitamin D level was 53.6 on 07/25/2020. She is currently taking prescription vitamin D 50,000 IU each week. She denies nausea, vomiting or muscle weakness.  Ref. Range 07/25/2020 08:22  Vitamin D, 25-Hydroxy Latest Ref Range: 30.0 - 100.0 ng/mL 53.6   Plan: Refill Vit D for 1 month, as per below. Low Vitamin D level contributes to fatigue and are associated with obesity, breast, and colon cancer. She agrees to continue to take prescription Vitamin  D @50 ,000 IU every week and will follow-up for routine testing of Vitamin D, at least 2-3 times per year to avoid over-replacement.  Refill- Vitamin D, Ergocalciferol, (DRISDOL) 1.25 MG (50000 UNIT) CAPS capsule; Take 1 capsule (50,000 Units total) by mouth every 7 (seven) days.  Dispense: 4 capsule; Refill: 0   3. At risk for impaired metabolic function Due to Kimberly Orr's current state of health and medical condition(s), they are at a significantly higher risk for impaired metabolic function.   This further puts the patient at much greater risk to also subsequently develop cardiopulmonary conditions that can negatively affect patient's quality of life as well.  At least 9 minutes was spent on counseling Kimberly Orr about these concerns today and I stressed the importance of reversing these risks factors.  Initial goal is to lose at least 5-10% of starting weight to help reduce risk factors.  Counseling: Intensive lifestyle modifications discussed with Kimberly Orr as most appropriate first line treatment.  she will continue to work on diet, exercise and weight loss efforts.  We will continue to reassess these conditions on a fairly regular basis in an attempt to decrease patient's overall morbidity and mortality    4. Class 1 obesity with serious comorbidity and body mass index (BMI) of 31.0 to 31.9 in adult, unspecified obesity type Kimberly Orr is currently in the action stage of change. As such, her goal is to continue with weight loss efforts. She has agreed to the Category 2 Plan.   Exercise goals: As is  Behavioral modification strategies: meal  planning and cooking strategies, keeping healthy foods in the home and planning for success.  Kimberly Orr has agreed to follow-up with our clinic in 2-3 weeks. She was informed of the importance of frequent follow-up visits to maximize her success with intensive lifestyle modifications for her multiple health conditions.   Objective:   Blood pressure 106/72,  pulse 62, temperature 97.9 F (36.6 C), height 5\' 5"  (1.651 m), weight 190 lb (86.2 kg), SpO2 98 %. Body mass index is 31.62 kg/m.  General: Cooperative, alert, well developed, in no acute distress. HEENT: Conjunctivae and lids unremarkable. Cardiovascular: Regular rhythm.  Lungs: Normal work of breathing. Neurologic: No focal deficits.   Lab Results  Component Value Date   CREATININE 0.87 04/15/2020   BUN 10 04/15/2020   NA 142 04/15/2020   K 4.1 04/15/2020   CL 107 04/15/2020   CO2 22 04/15/2020   Lab Results  Component Value Date   ALT 12 04/15/2020   AST 13 04/15/2020   ALKPHOS 83 04/15/2020   BILITOT 0.5 04/15/2020   Lab Results  Component Value Date   HGBA1C 5.5 07/25/2020   HGBA1C 5.5 04/20/2020   Lab Results  Component Value Date   INSULIN 11.3 07/25/2020   INSULIN 14.4 04/20/2020   Lab Results  Component Value Date   TSH 2.100 04/20/2020   Lab Results  Component Value Date   CHOL 143 04/15/2020   HDL 53.90 04/15/2020   LDLCALC 78 04/15/2020   TRIG 53.0 04/15/2020   CHOLHDL 3 04/15/2020   Lab Results  Component Value Date   WBC 8.4 04/15/2020   HGB 13.6 04/15/2020   HCT 41.9 04/15/2020   MCV 83.5 04/15/2020   PLT 271.0 04/15/2020   Lab Results  Component Value Date   IRON 34 07/25/2020   TIBC 367 07/25/2020   FERRITIN 27 07/25/2020    Attestation Statements:   Reviewed by clinician on day of visit: allergies, medications, problem list, medical history, surgical history, family history, social history, and previous encounter notes.  Kimberly Orr, am acting as Location manager for [employer], DO.  I have reviewed the above documentation for accuracy and completeness, and I agree with the above. Marjory Sneddon, D.O.  The [place] was signed into law in 2016 which includes the topic of electronic health records.  This provides immediate access to information in MyChart.  This includes consultation notes,  operative notes, office notes, lab results and pathology reports.  If you have any questions about what you read please let us know at your next visit so we can discuss your concerns and take corrective action if need be.  We are right here with you.

## 2020-11-03 MED FILL — METFORMIN HCL 500 MG TABS: 500 | 30 days supply | Qty: 60 | Fill #0

## 2020-11-14 ENCOUNTER — Encounter: Payer: Self-pay | Admitting: Family Medicine

## 2020-11-14 ENCOUNTER — Other Ambulatory Visit: Payer: Self-pay

## 2020-11-14 ENCOUNTER — Ambulatory Visit: Payer: 59 | Admitting: Family Medicine

## 2020-11-14 ENCOUNTER — Other Ambulatory Visit: Payer: Self-pay | Admitting: Family Medicine

## 2020-11-14 VITALS — BP 108/72 | HR 81 | Temp 98.0°F | Ht 65.0 in | Wt 196.1 lb

## 2020-11-14 DIAGNOSIS — R109 Unspecified abdominal pain: Secondary | ICD-10-CM | POA: Diagnosis not present

## 2020-11-14 MED ORDER — MELOXICAM 15 MG PO TABS: 15.0000 mg | ORAL_TABLET | Freq: Every day | ORAL | 0 refills | Status: DC

## 2020-11-14 MED ORDER — CYCLOBENZAPRINE HCL 10 MG PO TABS: 5.0000 mg | ORAL_TABLET | Freq: Three times a day (TID) | ORAL | 0 refills | Status: DC | PRN

## 2020-11-14 MED FILL — MELOXICAM 15 MG TABLET: 15 | 30 days supply | Qty: 30 | Fill #0

## 2020-11-14 NOTE — Progress Notes (Signed)
Chief Complaint  Patient presents with  . Abdominal Pain    Right side    Subjective: Patient is a 35 y.o. female here for R UQ abd pain.  Started 4 d ago. No inj or change in activity. Hurts on R upper abd pain and radiates to the back. No skin changes, swelling. Has some assoc nausea after meals. ahd similar issues several years ago and had neg RUQ [place]. BM's are normal. No shoulder pain. No neuro s/s's. Tried Tylenol with little relief.     Past Medical History:  Diagnosis Date  . Anemia   . Basal cell carcinoma   . Depression   . GERD (gastroesophageal reflux disease)   . History of chicken pox   . Joint pain   . Weight gain     Objective: BP 108/72 (BP Location: Left Arm, Patient Position: Sitting, Cuff Size: Normal)   Pulse 81   Temp 98 F (36.7 C) (Oral)   Ht 5\' 5"  (1.651 m)   Wt 196 lb 2 oz (89 kg)   SpO2 99%   BMI 32.64 kg/m  General: Awake, appears stated age Abd: S, BS+, ND, TTP in RUQ region, +Murphy's MSK: +TTP over R rib cage at ant axillary line at R 9-10 Heart: RRR Lungs: CTAB, no rales, wheezes or rhonchi. No accessory muscle use Psych: Age appropriate judgment and insight, normal affect and mood  Assessment and Plan: Right sided abdominal pain - Plan: meloxicam (MOBIC) 15 MG tablet, cyclobenzaprine (FLEXERIL) 10 MG tablet  I think this is related to her rib cage more than her gall bladder at this point. If no improvement, could consider PT vs RUQ [place].  The patient voiced understanding and agreement to the plan.  [place], DO 11/14/20  4:26 PM

## 2020-11-14 NOTE — Patient Instructions (Signed)
Ice/cold pack over area for 10-15 min twice daily.  Heat (pad or rice pillow in microwave) over affected area, 10-15 minutes twice daily.   OK to take Tylenol 1000 mg (2 extra strength tabs) or 975 mg (3 regular strength tabs) every 6 hours as needed.  Take Flexeril (cyclobenzaprine) 1-2 hours before planned bedtime. If it makes you drowsy, do not take during the day. You can try half a tab the following night.   Send me a message on MyChart if things don't get better.   Let us know if you need anything.

## 2020-11-21 ENCOUNTER — Encounter (INDEPENDENT_AMBULATORY_CARE_PROVIDER_SITE_OTHER): Payer: Self-pay | Admitting: Physician Assistant

## 2020-11-21 ENCOUNTER — Other Ambulatory Visit: Payer: Self-pay

## 2020-11-21 ENCOUNTER — Ambulatory Visit (INDEPENDENT_AMBULATORY_CARE_PROVIDER_SITE_OTHER): Payer: 59 | Admitting: Physician Assistant

## 2020-11-21 VITALS — BP 114/77 | HR 69 | Temp 97.6°F | Ht 65.0 in | Wt 191.0 lb

## 2020-11-21 DIAGNOSIS — E8881 Metabolic syndrome: Secondary | ICD-10-CM

## 2020-11-21 DIAGNOSIS — Z6831 Body mass index (BMI) 31.0-31.9, adult: Secondary | ICD-10-CM

## 2020-11-21 DIAGNOSIS — E559 Vitamin D deficiency, unspecified: Secondary | ICD-10-CM

## 2020-11-21 DIAGNOSIS — E669 Obesity, unspecified: Secondary | ICD-10-CM

## 2020-11-21 NOTE — Progress Notes (Signed)
Chief Complaint:   OBESITY Kimberly Orr is here to discuss her progress with her obesity treatment plan along with follow-up of her obesity related diagnoses. Kimberly Orr is on the Category 2 Plan and states she is following her eating plan approximately 85% of the time. Kimberly Orr states she is doing boot camp and walking for 45 minutes 3 times per week.  Today's visit was #: 12 Starting weight: 191 lbs Starting date: 04/20/2020 Today's weight: 191 lbs Today's date: 11/21/2020 Total lbs lost to date: 0 Total lbs lost since last in-office visit: 0  Interim History: Kimberly Orr reports being hungry sometimes between meals, especially lunch and dinner. She is bored with lunch and she is looking for more options. She is hydrating well.  Subjective:   1. Vitamin D deficiency [place] denies nausea, vomiting, or muscle weakness with Vit D weekly. She notes feeling fatigued.  2. Insulin resistance Kimberly Orr is on metformin twice daily. She notes being hungry between meals.  Assessment/Plan:   1. Vitamin D deficiency Low Vitamin D level contributes to fatigue and are associated with obesity, breast, and colon cancer. Kimberly Orr agreed to continue taking prescription Vitamin D 50,000 IU every week and will follow-up for routine testing of Vitamin D, at least 2-3 times per year to avoid over-replacement.  2. Insulin resistance Kimberly Orr will continue her medications, and will continue to work on weight loss, exercise, and decreasing simple carbohydrates to help decrease the risk of diabetes. Kimberly Orr agreed to follow-up with [place] as directed to closely monitor her progress.  3. Class 1 obesity with serious comorbidity and body mass index (BMI) of 31.0 to 31.9 in adult, unspecified obesity type Kimberly Orr is currently in the action stage of change. As such, her goal is to continue with weight loss efforts. She has agreed to change to the Category 3 Plan.   Exercise goals: As is.  Behavioral modification  strategies: meal planning and cooking strategies and keeping healthy foods in the home.  Kimberly Orr has agreed to follow-up with our clinic in 2 weeks. She was informed of the importance of frequent follow-up visits to maximize her success with intensive lifestyle modifications for her multiple health conditions.   Objective:   Blood pressure 114/77, pulse 69, temperature 97.6 F (36.4 C), height 5\' 5"  (1.651 m), weight 191 lb (86.6 kg), SpO2 99 %. Body mass index is 31.78 kg/m.  General: Cooperative, alert, well developed, in no acute distress. HEENT: Conjunctivae and lids unremarkable. Cardiovascular: Regular rhythm.  Lungs: Normal work of breathing. Neurologic: No focal deficits.   Lab Results  Component Value Date   CREATININE 0.87 04/15/2020   BUN 10 04/15/2020   NA 142 04/15/2020   K 4.1 04/15/2020   CL 107 04/15/2020   CO2 22 04/15/2020   Lab Results  Component Value Date   ALT 12 04/15/2020   AST 13 04/15/2020   ALKPHOS 83 04/15/2020   BILITOT 0.5 04/15/2020   Lab Results  Component Value Date   HGBA1C 5.5 07/25/2020   HGBA1C 5.5 04/20/2020   Lab Results  Component Value Date   INSULIN 11.3 07/25/2020   INSULIN 14.4 04/20/2020   Lab Results  Component Value Date   TSH 2.100 04/20/2020   Lab Results  Component Value Date   CHOL 143 04/15/2020   HDL 53.90 04/15/2020   LDLCALC 78 04/15/2020   TRIG 53.0 04/15/2020   CHOLHDL 3 04/15/2020   Lab Results  Component Value Date   WBC 8.4 04/15/2020   HGB 13.6  04/15/2020   HCT 41.9 04/15/2020   MCV 83.5 04/15/2020   PLT 271.0 04/15/2020   Lab Results  Component Value Date   IRON 34 07/25/2020   TIBC 367 07/25/2020   FERRITIN 27 07/25/2020   Attestation Statements:   Reviewed by clinician on day of visit: allergies, medications, problem list, medical history, surgical history, family history, social history, and previous encounter notes.  Time spent on visit including pre-visit chart review and  post-visit care and charting was 30 minutes.    Wilhemena Durie, am acting as transcriptionist for [employer], PA-C.  I have reviewed the above documentation for accuracy and completeness, and I agree with the above. Abby Potash, PA-C

## 2020-12-08 ENCOUNTER — Ambulatory Visit (INDEPENDENT_AMBULATORY_CARE_PROVIDER_SITE_OTHER): Payer: 59 | Admitting: Family Medicine

## 2020-12-08 ENCOUNTER — Other Ambulatory Visit (INDEPENDENT_AMBULATORY_CARE_PROVIDER_SITE_OTHER): Payer: Self-pay | Admitting: Family Medicine

## 2020-12-08 ENCOUNTER — Other Ambulatory Visit: Payer: Self-pay

## 2020-12-08 ENCOUNTER — Encounter (INDEPENDENT_AMBULATORY_CARE_PROVIDER_SITE_OTHER): Payer: Self-pay | Admitting: Family Medicine

## 2020-12-08 VITALS — BP 108/72 | HR 90 | Temp 97.5°F | Ht 65.0 in | Wt 190.0 lb

## 2020-12-08 DIAGNOSIS — E559 Vitamin D deficiency, unspecified: Secondary | ICD-10-CM | POA: Diagnosis not present

## 2020-12-08 DIAGNOSIS — Z6831 Body mass index (BMI) 31.0-31.9, adult: Secondary | ICD-10-CM

## 2020-12-08 DIAGNOSIS — E8881 Metabolic syndrome: Secondary | ICD-10-CM

## 2020-12-08 DIAGNOSIS — E669 Obesity, unspecified: Secondary | ICD-10-CM | POA: Diagnosis not present

## 2020-12-08 DIAGNOSIS — Z9189 Other specified personal risk factors, not elsewhere classified: Secondary | ICD-10-CM | POA: Diagnosis not present

## 2020-12-08 MED ORDER — VITAMIN D (ERGOCALCIFEROL) 1.25 MG (50000 UNIT) PO CAPS: 50000.0000 [IU] | ORAL_CAPSULE | ORAL | 0 refills | Status: DC

## 2020-12-08 MED ORDER — RYBELSUS 3 MG PO TABS: ORAL_TABLET | ORAL | 0 refills | Status: DC

## 2020-12-08 MED FILL — RYBELSUS 3 MG TABS: 3 | 30 days supply | Qty: 30 | Fill #0

## 2020-12-08 MED FILL — VIT D2 1.25 MG (50,000 UNIT: 1.25 MG | 28 days supply | Qty: 4 | Fill #0

## 2020-12-12 ENCOUNTER — Ambulatory Visit (INDEPENDENT_AMBULATORY_CARE_PROVIDER_SITE_OTHER): Payer: 59 | Admitting: Family Medicine

## 2020-12-15 NOTE — Progress Notes (Signed)
Chief Complaint:   OBESITY Kimberly Orr is here to discuss her progress with her obesity treatment plan along with follow-up of her obesity related diagnoses.   Today's visit was #: 13 Starting weight: 191 lbs Starting date: 04/20/2020 Today's weight: 190 lbs Today's date: 12/08/2020 Total lbs lost to date: 1 lb Body mass index is 31.62 kg/m.  Total weight loss percentage to date: -0.52%  Interim History:  Kimberly Orr lost her grandmother 2 days ago.  There has been increased stress for her and her family.  At last office visit, she changed to Category 3 from Category 2 due to increased hunger.  Now no hunger and no cravings.  She says she feels more satisfied.  Current Meal Plan: the Category 3 Plan for 80% of the time.  Current Exercise Plan: Burn [facility] for 45 minutes 3-4 times per week.  This patient is following the prescribed meal plan meal without concerns.  Food recall appears to be consistent with the prescribed plan.  When following the plan, hunger and cravings are well controlled.    Plan:  Start Rybelsus.  Assessment/Plan:   1. Insulin resistance Not at goal. Goal is HgbA1c < 5.7, fasting insulin closer to 5.  Medication:  metformin 500 mg daily.    Plan:  Discontinue metformin.  Start Rybelsus 3 mg daily, as per below.  She will continue to focus on protein-rich, low simple carbohydrate foods. We reviewed the importance of hydration, regular exercise for stress reduction, and restorative sleep.  Will recheck labs around 01/23/2021.  Lab Results  Component Value Date   HGBA1C 5.5 07/25/2020   Lab Results  Component Value Date   INSULIN 11.3 07/25/2020   INSULIN 14.4 04/20/2020   - Start Semaglutide (RYBELSUS) 3 MG TABS; 1 po qd  Dispense: 30 tablet; Refill: 0  2. Vitamin D deficiency At goal. Current vitamin D is 53.6, tested on 07/25/2020. Optimal goal > 50 ng/dL.  She is taking vitamin D 50,000 IU weekly.  Plan: Continue to take prescription Vitamin D  @50 ,000 IU every week as prescribed.  Follow-up for routine testing of Vitamin D, at least 2-3 times per year to avoid over-replacement.  - Refill Vitamin D, Ergocalciferol, (DRISDOL) 1.25 MG (50000 UNIT) CAPS capsule; Take 1 capsule (50,000 Units total) by mouth every 7 (seven) days.  Dispense: 4 capsule; Refill: 0  3. At risk for adverse drug interaction Due to Kimberly Orr's current conditions and medications, she is at a higher risk for drug side effect.  At least 9 minutes was spent on counseling her about these concerns today.  We discussed the benefits and potential risks of these medications, and all of patient's concerns were addressed and questions were answered.  she will call us, or their PCP or other specialists who treat their conditions with medications, with any questions or concerns that may develop.    4. Class 1 obesity with serious comorbidity and body mass index (BMI) of 31.0 to 31.9 in adult, unspecified obesity type  - Start Semaglutide (RYBELSUS) 3 MG TABS; 1 po qd  Dispense: 30 tablet; Refill: 0  Course: Kimberly Orr is currently in the action stage of change. As such, her goal is to continue with weight loss efforts.   Nutrition goals: She has agreed to the Category 3 Plan.   Exercise goals: As is.  Behavioral modification strategies: increasing lean protein intake, decreasing simple carbohydrates, meal planning and cooking strategies and keeping healthy foods in the home.  Kimberly Orr has agreed  to follow-up with our clinic in 2 weeks. She was informed of the importance of frequent follow-up visits to maximize her success with intensive lifestyle modifications for her multiple health conditions.   Objective:   Blood pressure 108/72, pulse 90, temperature (!) 97.5 F (36.4 C), height 5\' 5"  (1.651 m), weight 190 lb (86.2 kg), SpO2 97 %. Body mass index is 31.62 kg/m.  General: Cooperative, alert, well developed, in no acute distress. HEENT: Conjunctivae and lids  unremarkable. Cardiovascular: Regular rhythm.  Lungs: Normal work of breathing. Neurologic: No focal deficits.   Lab Results  Component Value Date   CREATININE 0.87 04/15/2020   BUN 10 04/15/2020   NA 142 04/15/2020   K 4.1 04/15/2020   CL 107 04/15/2020   CO2 22 04/15/2020   Lab Results  Component Value Date   ALT 12 04/15/2020   AST 13 04/15/2020   ALKPHOS 83 04/15/2020   BILITOT 0.5 04/15/2020   Lab Results  Component Value Date   HGBA1C 5.5 07/25/2020   HGBA1C 5.5 04/20/2020   Lab Results  Component Value Date   INSULIN 11.3 07/25/2020   INSULIN 14.4 04/20/2020   Lab Results  Component Value Date   TSH 2.100 04/20/2020   Lab Results  Component Value Date   CHOL 143 04/15/2020   HDL 53.90 04/15/2020   LDLCALC 78 04/15/2020   TRIG 53.0 04/15/2020   CHOLHDL 3 04/15/2020   Lab Results  Component Value Date   WBC 8.4 04/15/2020   HGB 13.6 04/15/2020   HCT 41.9 04/15/2020   MCV 83.5 04/15/2020   PLT 271.0 04/15/2020   Lab Results  Component Value Date   IRON 34 07/25/2020   TIBC 367 07/25/2020   FERRITIN 27 07/25/2020   Attestation Statements:   Reviewed by clinician on day of visit: allergies, medications, problem list, medical history, surgical history, family history, social history, and previous encounter notes.  I, Water quality scientist, CMA, am acting as Location manager for [employer], DO.  I have reviewed the above documentation for accuracy and completeness, and I agree with the above. Marjory Sneddon, D.O.  The [place] was signed into law in 2016 which includes the topic of electronic health records.  This provides immediate access to information in MyChart.  This includes consultation notes, operative notes, office notes, lab results and pathology reports.  If you have any questions about what you read please let us know at your next visit so we can discuss your concerns and take corrective action if need be.  We are right here  with you.

## 2020-12-26 ENCOUNTER — Ambulatory Visit (INDEPENDENT_AMBULATORY_CARE_PROVIDER_SITE_OTHER): Payer: 59 | Admitting: Family Medicine

## 2020-12-26 ENCOUNTER — Other Ambulatory Visit (INDEPENDENT_AMBULATORY_CARE_PROVIDER_SITE_OTHER): Payer: Self-pay | Admitting: Family Medicine

## 2020-12-26 ENCOUNTER — Other Ambulatory Visit: Payer: Self-pay

## 2020-12-26 ENCOUNTER — Encounter (INDEPENDENT_AMBULATORY_CARE_PROVIDER_SITE_OTHER): Payer: Self-pay | Admitting: Family Medicine

## 2020-12-26 VITALS — BP 109/74 | HR 63 | Temp 97.5°F | Ht 65.0 in | Wt 191.0 lb

## 2020-12-26 DIAGNOSIS — Z6831 Body mass index (BMI) 31.0-31.9, adult: Secondary | ICD-10-CM | POA: Diagnosis not present

## 2020-12-26 DIAGNOSIS — E8881 Metabolic syndrome: Secondary | ICD-10-CM | POA: Diagnosis not present

## 2020-12-26 DIAGNOSIS — Z9189 Other specified personal risk factors, not elsewhere classified: Secondary | ICD-10-CM | POA: Insufficient documentation

## 2020-12-26 DIAGNOSIS — E669 Obesity, unspecified: Secondary | ICD-10-CM | POA: Diagnosis not present

## 2020-12-26 DIAGNOSIS — E559 Vitamin D deficiency, unspecified: Secondary | ICD-10-CM | POA: Diagnosis not present

## 2020-12-26 MED ORDER — VITAMIN D (ERGOCALCIFEROL) 1.25 MG (50000 UNIT) PO CAPS: 50000.0000 [IU] | ORAL_CAPSULE | ORAL | 0 refills | Status: DC

## 2020-12-26 MED ORDER — RYBELSUS 3 MG PO TABS: ORAL_TABLET | ORAL | 0 refills | Status: DC

## 2020-12-28 NOTE — Progress Notes (Signed)
Chief Complaint:   OBESITY Kimberly Orr is here to discuss her progress with her obesity treatment plan along with follow-up of her obesity related diagnoses.   Today's visit was #: 14 Starting weight: 191 lbs Starting date: 04/20/2020 Today's weight: 191 lbs Today's date: 12/26/2020 Total lbs lost to date: 0 Body mass index is 31.78 kg/m.   Interim History:  Kimberly Orr is here for a follow up office visit.  she is following the meal plan without concern or issues.  Patient's meal and food recall appears to be accurate and consistent with what is on the plan.  When on plan, her hunger and cravings are well controlled.    Denies issues or concerns.  Some off plan eating, but not often.  Current Meal Plan: the Category 3 Plan for 85% of the time.  Current Exercise Plan: Burn [facility] for 45 minutes 3 times per week. Current Anti-Obesity Medications: Rybelsus 3 mg daily. Side effects: None.  Assessment/Plan:   1. Insulin resistance Improving, but not optimized. Goal is HgbA1c < 5.7, fasting insulin closer to 5.  Medication: Rybelsus 3 mg daily.   She started Rybelsus at last office visit.  Working well.  No side effects.  Helps with hunger.  She says she has to remind herself to eat.  Plan:  She will continue to focus on protein-rich, low simple carbohydrate foods. We reviewed the importance of hydration, regular exercise for stress reduction, and restorative sleep.   Lab Results  Component Value Date   HGBA1C 5.5 07/25/2020   Lab Results  Component Value Date   INSULIN 11.3 07/25/2020   INSULIN 14.4 04/20/2020   - Semaglutide (RYBELSUS) 3 MG TABS; 1 po qd  Dispense: 30 tablet; Refill: 0  2. Vitamin D deficiency At goal. Current vitamin D is 53.6, tested on 07/25/2020. Optimal goal > 50 ng/dL.  She is taking vitamin D 50,000 IU weekly.  Plan: Continue to take prescription Vitamin D @50 ,000 IU every week as prescribed.  Follow-up for routine testing of Vitamin D, at  least 2-3 times per year to avoid over-replacement.  - Refill Vitamin D, Ergocalciferol, (DRISDOL) 1.25 MG (50000 UNIT) CAPS capsule; Take 1 capsule (50,000 Units total) by mouth every 7 (seven) days.  Dispense: 4 capsule; Refill: 0  3. At risk for side effect of medication Due to Kimberly Orr's current conditions and medications, she is at a higher risk for drug side effect.  At least 9 minutes was spent on counseling her about these concerns today.  We discussed the benefits and potential risks of these medications, and all of patient's concerns were addressed and questions were answered.  she will call us, or their PCP or other specialists who treat their conditions with medications, with any questions or concerns that may develop.    4. Class 1 obesity with serious comorbidity and body mass index (BMI) of 31.0 to 31.9 in adult, unspecified obesity type  - Refill Semaglutide (RYBELSUS) 3 MG TABS; 1 po qd  Dispense: 30 tablet; Refill: 0  Course: Kimberly Orr is currently in the action stage of change. As such, her goal is to continue with weight loss efforts.   Nutrition goals: She has agreed to the Category 3 Plan.   Exercise goals: For substantial health benefits, adults should do at least 150 minutes (2 hours and 30 minutes) a week of moderate-intensity, or 75 minutes (1 hour and 15 minutes) a week of vigorous-intensity aerobic physical activity, or an equivalent combination of  moderate- and vigorous-intensity aerobic activity. Aerobic activity should be performed in episodes of at least 10 minutes, and preferably, it should be spread throughout the week.  Behavioral modification strategies: decreasing eating out, no skipping meals, meal planning and cooking strategies and planning for success.  Kimberly Orr has agreed to follow-up with our clinic in 2-3 weeks. She was informed of the importance of frequent follow-up visits to maximize her success with intensive lifestyle modifications for her multiple  health conditions.   Objective:   Blood pressure 109/74, pulse 63, temperature (!) 97.5 F (36.4 C), height 5\' 5"  (1.651 m), weight 191 lb (86.6 kg), SpO2 98 %. Body mass index is 31.78 kg/m.  General: Cooperative, alert, well developed, in no acute distress. HEENT: Conjunctivae and lids unremarkable. Cardiovascular: Regular rhythm.  Lungs: Normal work of breathing. Neurologic: No focal deficits.   Lab Results  Component Value Date   CREATININE 0.87 04/15/2020   BUN 10 04/15/2020   NA 142 04/15/2020   K 4.1 04/15/2020   CL 107 04/15/2020   CO2 22 04/15/2020   Lab Results  Component Value Date   ALT 12 04/15/2020   AST 13 04/15/2020   ALKPHOS 83 04/15/2020   BILITOT 0.5 04/15/2020   Lab Results  Component Value Date   HGBA1C 5.5 07/25/2020   HGBA1C 5.5 04/20/2020   Lab Results  Component Value Date   INSULIN 11.3 07/25/2020   INSULIN 14.4 04/20/2020   Lab Results  Component Value Date   TSH 2.100 04/20/2020   Lab Results  Component Value Date   CHOL 143 04/15/2020   HDL 53.90 04/15/2020   LDLCALC 78 04/15/2020   TRIG 53.0 04/15/2020   CHOLHDL 3 04/15/2020   Lab Results  Component Value Date   WBC 8.4 04/15/2020   HGB 13.6 04/15/2020   HCT 41.9 04/15/2020   MCV 83.5 04/15/2020   PLT 271.0 04/15/2020   Lab Results  Component Value Date   IRON 34 07/25/2020   TIBC 367 07/25/2020   FERRITIN 27 07/25/2020   Attestation Statements:   Reviewed by clinician on day of visit: allergies, medications, problem list, medical history, surgical history, family history, social history, and previous encounter notes.  I, Water quality scientist, CMA, am acting as Location manager for [employer], DO.  I have reviewed the above documentation for accuracy and completeness, and I agree with the above. Marjory Sneddon, D.O.  The [place] was signed into law in 2016 which includes the topic of electronic health records.  This provides immediate access to  information in MyChart.  This includes consultation notes, operative notes, office notes, lab results and pathology reports.  If you have any questions about what you read please let us know at your next visit so we can discuss your concerns and take corrective action if need be.  We are right here with you.

## 2021-01-06 ENCOUNTER — Other Ambulatory Visit: Payer: 59

## 2021-01-10 DIAGNOSIS — Z85828 Personal history of other malignant neoplasm of skin: Secondary | ICD-10-CM | POA: Diagnosis not present

## 2021-01-10 DIAGNOSIS — D229 Melanocytic nevi, unspecified: Secondary | ICD-10-CM | POA: Diagnosis not present

## 2021-01-10 DIAGNOSIS — L814 Other melanin hyperpigmentation: Secondary | ICD-10-CM | POA: Diagnosis not present

## 2021-01-10 DIAGNOSIS — X32XXXS Exposure to sunlight, sequela: Secondary | ICD-10-CM | POA: Diagnosis not present

## 2021-01-10 DIAGNOSIS — L578 Other skin changes due to chronic exposure to nonionizing radiation: Secondary | ICD-10-CM | POA: Diagnosis not present

## 2021-01-13 ENCOUNTER — Other Ambulatory Visit: Payer: Self-pay | Admitting: Obstetrics and Gynecology

## 2021-01-13 ENCOUNTER — Other Ambulatory Visit: Payer: Self-pay

## 2021-01-13 ENCOUNTER — Ambulatory Visit
Admission: RE | Admit: 2021-01-13 | Discharge: 2021-01-13 | Disposition: A | Discharge status: Home / Self Care | Payer: 59 | Source: Ambulatory Visit | Attending: Obstetrics and Gynecology | Admitting: Obstetrics and Gynecology

## 2021-01-13 DIAGNOSIS — N6310 Unspecified lump in the right breast, unspecified quadrant: Secondary | ICD-10-CM

## 2021-01-13 DIAGNOSIS — N6311 Unspecified lump in the right breast, upper outer quadrant: Secondary | ICD-10-CM | POA: Diagnosis not present

## 2021-01-21 ENCOUNTER — Other Ambulatory Visit (HOSPITAL_BASED_OUTPATIENT_CLINIC_OR_DEPARTMENT_OTHER): Payer: Self-pay

## 2021-01-23 ENCOUNTER — Ambulatory Visit (INDEPENDENT_AMBULATORY_CARE_PROVIDER_SITE_OTHER): Payer: 59 | Admitting: Family Medicine

## 2021-01-30 ENCOUNTER — Ambulatory Visit (INDEPENDENT_AMBULATORY_CARE_PROVIDER_SITE_OTHER): Payer: 59 | Admitting: Family Medicine

## 2021-02-02 DIAGNOSIS — M5416 Radiculopathy, lumbar region: Secondary | ICD-10-CM | POA: Diagnosis not present

## 2021-02-07 ENCOUNTER — Ambulatory Visit (INDEPENDENT_AMBULATORY_CARE_PROVIDER_SITE_OTHER): Payer: 59 | Admitting: Family Medicine

## 2021-02-07 DIAGNOSIS — M545 Low back pain, unspecified: Secondary | ICD-10-CM | POA: Diagnosis not present

## 2021-02-07 DIAGNOSIS — M5416 Radiculopathy, lumbar region: Secondary | ICD-10-CM | POA: Diagnosis not present

## 2021-02-09 ENCOUNTER — Other Ambulatory Visit (HOSPITAL_BASED_OUTPATIENT_CLINIC_OR_DEPARTMENT_OTHER): Payer: Self-pay

## 2021-02-14 ENCOUNTER — Other Ambulatory Visit (HOSPITAL_BASED_OUTPATIENT_CLINIC_OR_DEPARTMENT_OTHER): Payer: Self-pay

## 2021-02-14 DIAGNOSIS — M5126 Other intervertebral disc displacement, lumbar region: Secondary | ICD-10-CM | POA: Diagnosis not present

## 2021-02-14 MED ORDER — METHYLPREDNISOLONE 4 MG PO TBPK
ORAL_TABLET | ORAL | 0 refills | Status: DC
  Filled 2021-02-14: qty 21, 6d supply, fill #0

## 2021-02-15 ENCOUNTER — Other Ambulatory Visit (INDEPENDENT_AMBULATORY_CARE_PROVIDER_SITE_OTHER): Payer: Self-pay | Admitting: Family Medicine

## 2021-02-15 DIAGNOSIS — E669 Obesity, unspecified: Secondary | ICD-10-CM

## 2021-02-15 DIAGNOSIS — Z6831 Body mass index (BMI) 31.0-31.9, adult: Secondary | ICD-10-CM

## 2021-02-15 DIAGNOSIS — E8881 Metabolic syndrome: Secondary | ICD-10-CM

## 2021-02-15 NOTE — Telephone Encounter (Signed)
Dr.Opalski ?

## 2021-02-16 ENCOUNTER — Other Ambulatory Visit (HOSPITAL_BASED_OUTPATIENT_CLINIC_OR_DEPARTMENT_OTHER): Payer: Self-pay

## 2021-02-20 ENCOUNTER — Other Ambulatory Visit (HOSPITAL_BASED_OUTPATIENT_CLINIC_OR_DEPARTMENT_OTHER): Payer: Self-pay

## 2021-02-20 MED ORDER — TRAMADOL HCL 50 MG PO TABS
ORAL_TABLET | ORAL | 1 refills | Status: DC
  Filled 2021-02-20: qty 60, 10d supply, fill #0

## 2021-02-21 ENCOUNTER — Ambulatory Visit (INDEPENDENT_AMBULATORY_CARE_PROVIDER_SITE_OTHER): Payer: 59 | Admitting: Family Medicine

## 2021-02-27 ENCOUNTER — Other Ambulatory Visit (HOSPITAL_BASED_OUTPATIENT_CLINIC_OR_DEPARTMENT_OTHER): Payer: Self-pay

## 2021-02-27 DIAGNOSIS — M5127 Other intervertebral disc displacement, lumbosacral region: Secondary | ICD-10-CM | POA: Diagnosis not present

## 2021-02-27 DIAGNOSIS — M5117 Intervertebral disc disorders with radiculopathy, lumbosacral region: Secondary | ICD-10-CM | POA: Diagnosis not present

## 2021-02-27 MED ORDER — TRAMADOL HCL 50 MG PO TABS
ORAL_TABLET | ORAL | 1 refills | Status: DC
  Filled 2021-02-27: qty 60, 10d supply, fill #0

## 2021-02-27 MED ORDER — CYCLOBENZAPRINE HCL 10 MG PO TABS
ORAL_TABLET | ORAL | 1 refills | Status: DC
  Filled 2021-02-27: qty 50, 17d supply, fill #0

## 2021-03-06 ENCOUNTER — Other Ambulatory Visit (HOSPITAL_BASED_OUTPATIENT_CLINIC_OR_DEPARTMENT_OTHER): Payer: Self-pay

## 2021-03-17 ENCOUNTER — Other Ambulatory Visit (HOSPITAL_BASED_OUTPATIENT_CLINIC_OR_DEPARTMENT_OTHER): Payer: Self-pay

## 2021-03-30 ENCOUNTER — Other Ambulatory Visit (HOSPITAL_COMMUNITY): Payer: Self-pay | Admitting: Student

## 2021-03-30 ENCOUNTER — Other Ambulatory Visit: Payer: Self-pay

## 2021-03-30 ENCOUNTER — Ambulatory Visit (HOSPITAL_COMMUNITY)
Admission: RE | Admit: 2021-03-30 | Discharge: 2021-03-30 | Disposition: A | Discharge status: Home / Self Care | Payer: 59 | Source: Ambulatory Visit | Attending: Student | Admitting: Student

## 2021-03-30 DIAGNOSIS — M7989 Other specified soft tissue disorders: Secondary | ICD-10-CM

## 2021-04-06 ENCOUNTER — Other Ambulatory Visit: Payer: Self-pay

## 2021-04-06 ENCOUNTER — Encounter: Payer: Self-pay | Admitting: Physical Therapy

## 2021-04-06 ENCOUNTER — Ambulatory Visit (INDEPENDENT_AMBULATORY_CARE_PROVIDER_SITE_OTHER): Payer: 59 | Admitting: Physical Therapy

## 2021-04-06 DIAGNOSIS — M5416 Radiculopathy, lumbar region: Secondary | ICD-10-CM

## 2021-04-06 DIAGNOSIS — M6281 Muscle weakness (generalized): Secondary | ICD-10-CM | POA: Diagnosis not present

## 2021-04-06 NOTE — Patient Instructions (Signed)
Access Code: [email or web address] URL: [email or web address] Date: 04/06/2021 Prepared by: Almyra Free  Exercises Supine Sciatic Nerve Glide - 2 x daily - 7 x weekly - 1 sets - 10 reps - 5 sec hold Supine Bridge - 2 x daily - 7 x weekly - 1-3 sets - 10 reps Standard Plank - 2 x daily - 7 x weekly - 1 sets - 5 reps - max hold Gastroc Stretch on Wall - 2 x daily - 7 x weekly - 1 sets - 10 reps Prone Hip Extension with Pillow Under Abdomen - 2 x daily - 7 x weekly - 3 sets - 10 reps

## 2021-04-06 NOTE — Therapy (Signed)
[place], [place], [address] Phone: [phone]   Fax:  [phone]  Physical Therapy Evaluation  Patient Details  Name: Kimberly Orr MRN: [id number] Date of Birth: 12-31-1985 Referring Provider (PT): Glenford Peers, NP   Encounter Date: 04/06/2021   PT End of Session - 04/06/21 1019     Visit Number 1    Number of Visits 12    Date for PT Re-Evaluation 05/18/21    Authorization Type UMR    PT Start Time 1019    PT Stop Time 1057    PT Time Calculation (min) 38 min    Activity Tolerance Patient tolerated treatment well    Behavior During Therapy WFL for tasks assessed/performed             Past Medical History:  Diagnosis Date   Anemia    Basal cell carcinoma    Depression    GERD (gastroesophageal reflux disease)    History of chicken pox    Joint pain    Weight gain     Past Surgical History:  Procedure Laterality Date   DILATION AND CURETTAGE OF UTERUS  2011   WISDOM TOOTH EXTRACTION      There were no vitals filed for this visit.    Subjective Assessment - 04/06/21 1019     Subjective Patient had left lumbar laminectomy L5/S1 02/27/21 and had immediate relief of pain and majority of numbness. she reports some topical numbness in the left calf. Intermittent tail bone soreness and calf. Calf and ankle feel weaker. Patient works at [employer] in [place].    Pertinent History L5/S1 lami    Patient Stated Goals Get the left side stronger    Currently in Pain? No/denies                [facility] PT Assessment - 04/06/21 0001       Assessment   Medical Diagnosis radiculopathy, lumbar region    Referring Provider (PT) Glenford Peers, NP    Onset Date/Surgical Date 02/27/21    Hand Dominance Right    Next MD Visit 04/11/21      Precautions   Precautions Back    Precaution Comments keep spine neutral. only light lifting      Restrictions   Weight Bearing  Restrictions No      Balance Screen   Has the patient fallen in the past 6 months No    Has the patient had a decrease in activity level because of a fear of falling?  No    Is the patient reluctant to leave their home because of a fear of falling?  No      Home Ecologist residence    Living Arrangements Spouse/significant other    Additional Comments 2 story      Prior Function   Level of Independence Independent    Vocation Full time employment    Vocation Requirements [place] has pool at home      Observation/Other Assessments   Skin Integrity incision healiing well      Posture/Postural Control   Posture Comments sway back posture      ROM / Strength   AROM / PROM / Strength AROM;Strength      AROM   Overall AROM Comments lumbar flex hands to shins (HS tight); ext not tested; left SB 75%, left rot 75%,  Strength   Overall Strength Comments Rt hip flex and ext 4+/5 due to poor left lumbar stabilization; Left knee flex 4/5, ankle ever 4/5; plantar flexors can only do 5 SL heel raise; otherwise bil LE 5/5.      Flexibility   Soft Tissue Assessment /Muscle Length yes    Hamstrings Rt WNL; Lt limited by nerve tension    Quadriceps mild tightness bil    ITB WNL    Piriformis WNL      Palpation   Spinal mobility not tested    Palpation comment bil gluteals and left lumbar mild tenderness                        Objective measurements completed on examination: See above findings.               PT Education - 04/06/21 1059     Education Details HEP    Person(s) Educated Patient    Methods Explanation;Demonstration;Handout    Comprehension Verbalized understanding;Returned demonstration                 PT Long Term Goals - 04/06/21 1117       PT LONG TERM GOAL #1   Title Ind with HEP and progression.    Time 6    Period Weeks    Status New    Target Date 05/18/21       PT LONG TERM GOAL #2   Title Patient to demonstrate 4+/5 to 5/5 strength in her left ankle evertors and plantar flexors and left HS to improve functional activites.    Time 6    Period Weeks    Status New      PT LONG TERM GOAL #3   Title Patient to demonstrate improved core strength and stabilization with 5/5 MMT of Rt hip flex and ext.    Time 6    Period Weeks    Status New      PT LONG TERM GOAL #4   Title Patient to report decreased left sciatic nerve tension in supine by >= 50% to help improve lumbar mobility.    Time 6    Period Weeks    Status New                    Plan - 04/06/21 1059     Clinical Impression Statement Patient presents s/p left lumbar laminectomy on 02/27/21. She now has only mild pain, tightness and numbness mainly in the left calf. She reports that her left side feels weak especially the ankle. She is weak in her lumbar stabilizers, left hamstrings and left ankle evertors and plantar flexors. She has some tightness in bil quads and positive sciatic nerve tension on the left. 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 She has a pool at home and would benefit from HEP for aquatic therapy as well as land.  She will benefit from PT to address these deficits.    Stability/Clinical Decision Making Stable/Uncomplicated    Clinical Decision Making Low    Rehab Potential Excellent    PT Frequency 2x / week    PT Duration 6 weeks    PT Treatment/Interventions ADLs/Self Care Home Management;Aquatic Therapy;Cryotherapy;Electrical Stimulation;Moist Heat;Therapeutic activities;Therapeutic exercise;Neuromuscular re-education;Balance training;Patient/family education;Manual techniques;Dry needling;Taping    PT Next Visit Plan Review HEP, add aquatic HEP, work on lumbar stab and core (neutral spine), ankle eversion, gastroc/soleus and HS  strength    PT Home Exercise Plan [email or web address]    Consulted and Agree with Plan of Care Patient              Patient will benefit from skilled therapeutic intervention in order to improve the following deficits and impairments:  Pain, Decreased strength, Impaired flexibility, Decreased range of motion  Visit Diagnosis: Muscle weakness (generalized) - Plan: PT plan of care cert/re-cert  Radiculopathy, lumbar region - Plan: PT plan of care cert/re-cert     Problem List Patient Active Problem List   Diagnosis Date Noted   At risk for side effect of medication 12/26/2020   Insulin resistance 08/31/2020   Iron deficiency 08/31/2020   Vitamin D deficiency 08/31/2020   At risk for impaired metabolic function 65/46/5035   Class 1 obesity without serious comorbidity with body mass index (BMI) of 32.0 to 32.9 in adult 04/05/2020   Well adult exam 01/13/2018   Weight gain 09/10/2017   Hair loss 09/10/2017   Madelyn Flavors PT 04/06/2021, 11:24 AM  [facility] [place], [place], [address] Phone: [phone]   Fax:  [phone]  Name: SHOSHANNA MCQUITTY MRN: [id number] Date of Birth: Feb 14, 1986

## 2021-04-10 ENCOUNTER — Other Ambulatory Visit: Payer: Self-pay

## 2021-04-10 ENCOUNTER — Ambulatory Visit (INDEPENDENT_AMBULATORY_CARE_PROVIDER_SITE_OTHER): Payer: 59 | Admitting: Family Medicine

## 2021-04-10 ENCOUNTER — Encounter: Payer: Self-pay | Admitting: Family Medicine

## 2021-04-10 VITALS — BP 120/78 | HR 95 | Temp 97.8°F | Ht 65.0 in | Wt 198.4 lb

## 2021-04-10 DIAGNOSIS — Z0000 Encounter for general adult medical examination without abnormal findings: Secondary | ICD-10-CM

## 2021-04-10 NOTE — Patient Instructions (Addendum)
Give us 2-3 business days to get the results of your labs back.   Keep the diet clean and stay active.  Let us know if you need anything. 

## 2021-04-10 NOTE — Progress Notes (Signed)
Chief Complaint  Patient presents with   Annual Exam     Well Woman Kimberly Orr is here for a complete physical.   Her last physical was >1 year ago.  Current diet: in general, a "healthy" diet. Current exercise: aquatic PT. Fatigue out of ordinary? No Seatbelt? Yes  Health Maintenance Pap/HPV- Yes Tetanus- Yes HIV screening- Yes Hep C screening- Yes  Past Medical History:  Diagnosis Date   Anemia    Basal cell carcinoma    Depression    GERD (gastroesophageal reflux disease)    History of chicken pox    Joint pain    Weight gain      Past Surgical History:  Procedure Laterality Date   DILATION AND CURETTAGE OF UTERUS  2011   WISDOM TOOTH EXTRACTION      Medications  Current Outpatient Medications on File Prior to Visit  Medication Sig Dispense Refill   ferrous sulfate 325 (65 FE) MG tablet Take 1 tablet (325 mg total) by mouth daily with breakfast. 30 tablet 0   Multiple Vitamins-Minerals (WOMENS MULTIVITAMIN PO)      Semaglutide 3 MG TABS TAKE 1 TABLET BY MOUTH ONCE DAILY 30 tablet 0   Vitamin D, Ergocalciferol, (DRISDOL) 1.25 MG (50000 UNIT) CAPS capsule TAKE 1 CAPSULE BY MOUTH EVERY 7 DAYS 4 capsule 0   [DISCONTINUED] metFORMIN (GLUCOPHAGE) 500 MG tablet Take 1 tablet (500 mg total) by mouth in the morning and at bedtime. 60 tablet 0    Allergies No Known Allergies  Review of Systems: Constitutional:  no unexpected weight changes Eye:  no recent significant change in vision Ear/Nose/Mouth/Throat:  Ears:  no tinnitus or vertigo and no recent change in hearing Nose/Mouth/Throat:  no complaints of nasal congestion, no sore throat Cardiovascular: no chest pain Respiratory:  no cough and no shortness of breath Gastrointestinal:  no abdominal pain, no change in bowel habits GU:  Female: negative for dysuria or pelvic pain Musculoskeletal/Extremities:  no pain of the joints Integumentary (Skin/Breast):  no abnormal skin lesions reported Neurologic:  no  headaches Endocrine:  denies fatigue Hematologic/Lymphatic:  No areas of easy bleeding  Exam BP 120/78   Pulse 95   Temp 97.8 F (36.6 C) (Oral)   Ht 5\' 5"  (1.651 m)   Wt 198 lb 6 oz (90 kg)   SpO2 99%   BMI 33.01 kg/m  General:  well developed, well nourished, in no apparent distress Skin:  no significant moles, warts, or growths Head:  no masses, lesions, or tenderness Eyes:  pupils equal and round, sclera anicteric without injection Ears:  canals without lesions, TMs shiny without retraction, no obvious effusion, no erythema Nose:  nares patent, septum midline, mucosa normal, and no drainage or sinus tenderness Throat/Pharynx:  lips and gingiva without lesion; tongue and uvula midline; non-inflamed pharynx; no exudates or postnasal drainage Neck: neck supple without adenopathy, thyromegaly, or masses Lungs:  clear to auscultation, breath sounds equal bilaterally, no respiratory distress Cardio:  regular rate and rhythm, no bruits, no LE edema Abdomen:  abdomen soft, nontender; bowel sounds normal; no masses or organomegaly Genital: Defer to GYN Musculoskeletal:  symmetrical muscle groups noted without atrophy or deformity Extremities:  no clubbing, cyanosis, or edema, no deformities, no skin discoloration Neuro:  gait normal; deep tendon reflexes normal and symmetric Psych: well oriented with normal range of affect and appropriate judgment/insight  Assessment and Plan  Well adult exam - Plan: CBC, Comprehensive metabolic panel, Lipid panel   Well 35 y.o.  female. Counseled on diet and exercise. Other orders as above. Future labs.  Follow up in 1 yr for CPE or prn. The patient voiced understanding and agreement to the plan.  [place], DO 04/10/21 3:30 PM

## 2021-04-11 ENCOUNTER — Ambulatory Visit: Payer: 59 | Admitting: Physical Therapy

## 2021-04-11 DIAGNOSIS — M6281 Muscle weakness (generalized): Secondary | ICD-10-CM | POA: Diagnosis not present

## 2021-04-11 DIAGNOSIS — M5416 Radiculopathy, lumbar region: Secondary | ICD-10-CM

## 2021-04-11 NOTE — Therapy (Signed)
[facility]  [place], [place], [address] Phone: [phone]   Fax:  [phone]  Physical Therapy Treatment  Patient Details  Name: Kimberly Orr MRN: [id number] Date of Birth: 08/07/1986 Referring Provider (PT): Glenford Peers, NP   Encounter Date: 04/11/2021   PT End of Session - 04/11/21 1004     Visit Number 2    Number of Visits 12    Date for PT Re-Evaluation 05/18/21    PT Start Time 0925    PT Stop Time 1003    PT Time Calculation (min) 38 min    Activity Tolerance Patient tolerated treatment well    Behavior During Therapy [facility] for tasks assessed/performed             Past Medical History:  Diagnosis Date   Anemia    Basal cell carcinoma    Depression    GERD (gastroesophageal reflux disease)    History of chicken pox    Joint pain    Weight gain     Past Surgical History:  Procedure Laterality Date   DILATION AND CURETTAGE OF UTERUS  2011   WISDOM TOOTH EXTRACTION      There were no vitals filed for this visit.   Subjective Assessment - 04/11/21 0930     Subjective Pt states she is feeling much  better, Lt calf is still feeling sore and like it could cramp    Patient Stated Goals Get the left side stronger    Currently in Pain? Yes    Pain Score 2     Pain Location Calf    Pain Orientation Left    Pain Descriptors / Indicators Aching                [facility] PT Assessment - 04/11/21 0001       Assessment   Medical Diagnosis radiculopathy, lumbar region    Referring Provider (PT) Glenford Peers, NP    Onset Date/Surgical Date 02/27/21      Strength   Overall Strength Comments Lt ankle eversion 4/5                           [facility] Adult PT Treatment/Exercise - 04/11/21 0001       Exercises   Exercises Lumbar;Ankle      Lumbar Exercises: Stretches   Other Lumbar Stretch Exercise sciatic nerve glide Lt x 10      Lumbar Exercises: Aerobic    Nustep 5 min level 5      Lumbar Exercises: Supine   Ab Set 5 reps;5 seconds    AB Set Limitations tactile cues    Clam 10 reps    Clam Limitations with ab set    Bridge 20 reps      Lumbar Exercises: Prone   Straight Leg Raise 20 reps    Other Prone Lumbar Exercises plank 3  x20 sec      Manual Therapy   Manual Therapy Soft tissue mobilization    Soft tissue mobilization STM lumbar paraspinals      Ankle Exercises: Stretches   Soleus Stretch 2 reps;20 seconds    Gastroc Stretch 2 reps;20 seconds      Ankle Exercises: Standing   Vector Stance Left    Vector Stance Limitations 10 reps    SLS up to 30 seconds x 2    Heel Raises 10 reps    Other Standing Ankle Exercises side  step with red TB around feet 2 x 10 steps bilat      Ankle Exercises: Supine   T-Band eversion red TB x 20                         PT Long Term Goals - 04/06/21 1117       PT LONG TERM GOAL #1   Title Ind with HEP and progression.    Time 6    Period Weeks    Status New    Target Date 05/18/21      PT LONG TERM GOAL #2   Title Patient to demonstrate 4+/5 to 5/5 strength in her left ankle evertors and plantar flexors and left HS to improve functional activites.    Time 6    Period Weeks    Status New      PT LONG TERM GOAL #3   Title Patient to demonstrate improved core strength and stabilization with 5/5 MMT of Rt hip flex and ext.    Time 6    Period Weeks    Status New      PT LONG TERM GOAL #4   Title Patient to report decreased left sciatic nerve tension in supine by >= 50% to help improve lumbar mobility.    Time 6    Period Weeks    Status New                   Plan - 04/11/21 1004     Clinical Impression Statement Pt with good tolerance to addition of calf/ankle strengthening and core strengthening today. Fatigues quickly with planks and requires cues for form. Pt returns to MD after session today    PT Next Visit Plan update HEP, aquatic HEP,  continue core strengthening and calf strengthening    PT Home Exercise Plan [email or web address]    Consulted and Agree with Plan of Care Patient             Patient will benefit from skilled therapeutic intervention in order to improve the following deficits and impairments:     Visit Diagnosis: Muscle weakness (generalized)  Radiculopathy, lumbar region     Problem List Patient Active Problem List   Diagnosis Date Noted   At risk for side effect of medication 12/26/2020   Insulin resistance 08/31/2020   Iron deficiency 08/31/2020   Vitamin D deficiency 08/31/2020   At risk for impaired metabolic function 71/69/6789   Class 1 obesity without serious comorbidity with body mass index (BMI) of 32.0 to 32.9 in adult 04/05/2020   Well adult exam 01/13/2018   Weight gain 09/10/2017   Hair loss 09/10/2017   Kimberly Orr, PT  Kimberly Orr 04/11/2021, 10:06 AM  [facility] [place], [place], [address] Phone: [phone]   Fax:  [phone]  Name: Kimberly Orr MRN: [id number] Date of Birth: Nov 02, 1985

## 2021-04-13 ENCOUNTER — Other Ambulatory Visit: Payer: Self-pay

## 2021-04-13 ENCOUNTER — Ambulatory Visit (INDEPENDENT_AMBULATORY_CARE_PROVIDER_SITE_OTHER): Payer: 59 | Admitting: Physical Therapy

## 2021-04-13 ENCOUNTER — Encounter: Payer: Self-pay | Admitting: Physical Therapy

## 2021-04-13 DIAGNOSIS — M5416 Radiculopathy, lumbar region: Secondary | ICD-10-CM | POA: Diagnosis not present

## 2021-04-13 DIAGNOSIS — M6281 Muscle weakness (generalized): Secondary | ICD-10-CM | POA: Diagnosis not present

## 2021-04-13 NOTE — Patient Instructions (Signed)
In pool:   Walking: forward, backward, side stepping  (straight leg or with mini squat)  Abdominal set with hands gently pressing noodle under water.  5 sec holds.  Noodle row Knee towards noodle

## 2021-04-14 NOTE — Therapy (Signed)
[place], [place], [address] Phone: [phone]   Fax:  [phone]  Physical Therapy Treatment  Patient Details  Name: Kimberly Orr MRN: [id number] Date of Birth: [birth year] Referring Provider (PT): Glenford Peers, NP   Encounter Date: 04/13/2021   PT End of Session - 04/13/21 1102     Visit Number 3    Number of Visits 12    Date for PT Re-Evaluation 05/18/21    Authorization Type UMR    PT Start Time 1101    PT Stop Time 0947    PT Time Calculation (min) 42 min    Activity Tolerance Patient tolerated treatment well    Behavior During Therapy [facility] for tasks assessed/performed             Past Medical History:  Diagnosis Date   Anemia    Basal cell carcinoma    Depression    GERD (gastroesophageal reflux disease)    History of chicken pox    Joint pain    Weight gain     Past Surgical History:  Procedure Laterality Date   DILATION AND CURETTAGE OF UTERUS  2011   WISDOM TOOTH EXTRACTION      There were no vitals filed for this visit.   Subjective Assessment - 04/13/21 1103     Subjective Pt reports she had follow up with surgeon; pleased with progress.  She has had some more soreness in LE from exercises. She returns to work Aug 1.    Patient Stated Goals Get the left side stronger    Currently in Pain? Yes    Pain Location Back    Pain Orientation Left    Pain Descriptors / Indicators Aching                [facility] PT Assessment - 04/14/21 0001       Assessment   Medical Diagnosis radiculopathy, lumbar region    Referring Provider (PT) Glenford Peers, NP    Onset Date/Surgical Date 02/27/21    Next MD Visit PRN                           [facility] Adult PT Treatment/Exercise - 04/14/21 0001       Lumbar Exercises: Stretches   Passive Hamstring Stretch Right;Left;2 reps;20 seconds   hooklying with strap   Piriformis Stretch Left;2 reps;20  seconds   supine   Figure 4 Stretch 2 reps;20 seconds    Gastroc Stretch Left;4 reps;10 seconds    Other Lumbar Stretch Exercise sciatic nerve glide Lt x 10      Lumbar Exercises: Aerobic   Nustep L5: 5 min      Lumbar Exercises: Standing   Other Standing Lumbar Exercises went over pool exercises:  walking forward, backward, side stepping with/without squat.  TA set with hands pushing down on noodle x 5 sec (pt performed 3 reps), noodle row. Knee to noodle    Other Standing Lumbar Exercises Side plank with hand on counter x 20 sec x 2 reps each side. tall plank with hands on counter x 20 sec (not challenging enough).  SLS x 20 sec each leg.      Lumbar Exercises: Supine   Bent Knee Raise 5 reps   with ab set   Dead Bug 5 reps    Bridge 10 reps;5 seconds   cues for ab and glute set     Lumbar  Exercises: Prone   Other Prone Lumbar Exercises opp arm / leg x 10x 2 sets                         PT Long Term Goals - 04/06/21 1117       PT LONG TERM GOAL #1   Title Ind with HEP and progression.    Time 6    Period Weeks    Status New    Target Date 05/18/21      PT LONG TERM GOAL #2   Title Patient to demonstrate 4+/5 to 5/5 strength in her left ankle evertors and plantar flexors and left HS to improve functional activites.    Time 6    Period Weeks    Status New      PT LONG TERM GOAL #3   Title Patient to demonstrate improved core strength and stabilization with 5/5 MMT of Rt hip flex and ext.    Time 6    Period Weeks    Status New      PT LONG TERM GOAL #4   Title Patient to report decreased left sciatic nerve tension in supine by >= 50% to help improve lumbar mobility.    Time 6    Period Weeks    Status New                   Plan - 04/13/21 1111     Clinical Impression Statement Pt reported plank was painful when performed at home; held for now from HEP.  Pt tolerated all other exercises without any increase in pain.  Time spent pt regarding  aquatic exercises to trial in her pool; pt verbalized understanding.  Progressing well towards goals.    Rehab Potential Excellent    PT Frequency 2x / week    PT Duration 6 weeks    PT Treatment/Interventions ADLs/Self Care Home Management;Aquatic Therapy;Cryotherapy;Electrical Stimulation;Moist Heat;Therapeutic activities;Therapeutic exercise;Neuromuscular re-education;Balance training;Patient/family education;Manual techniques;Dry needling;Taping    PT Next Visit Plan update HEP as needed, aquatic HEP, continue core strengthening and calf strengthening    PT Home Exercise Plan [email or web address]    Consulted and Agree with Plan of Care Patient             Patient will benefit from skilled therapeutic intervention in order to improve the following deficits and impairments:  Pain, Decreased strength, Impaired flexibility, Decreased range of motion  Visit Diagnosis: Muscle weakness (generalized)  Radiculopathy, lumbar region     Problem List Patient Active Problem List   Diagnosis Date Noted   At risk for side effect of medication 12/26/2020   Insulin resistance 08/31/2020   Iron deficiency 08/31/2020   Vitamin D deficiency 08/31/2020   At risk for impaired metabolic function 16/07/9603   Class 1 obesity without serious comorbidity with body mass index (BMI) of 32.0 to 32.9 in adult 04/05/2020   Well adult exam 01/13/2018   Weight gain 09/10/2017   Hair loss 09/10/2017   [name], PTA 04/14/21 8:03 AM   [place], [place], [address] Phone: [phone]   Fax:  [phone]  Name: Kimberly Orr MRN: [id number] Date of Birth: September 30, 1986

## 2021-04-14 NOTE — Therapy (Signed)
[place], [place], [address] Phone: [phone]   Fax:  [phone]  Physical Therapy Treatment  Patient Details  Name: ADDILEE NEU MRN: [id number] Date of Birth: 19-Jan-1986 Referring Provider (PT): Glenford Peers, NP   Encounter Date: 04/13/2021   PT End of Session - 04/13/21 1102     Visit Number 3    Number of Visits 12    Date for PT Re-Evaluation 05/18/21    Authorization Type UMR    PT Start Time 1101    PT Stop Time 3329    PT Time Calculation (min) 42 min    Activity Tolerance Patient tolerated treatment well    Behavior During Therapy [facility] for tasks assessed/performed             Past Medical History:  Diagnosis Date   Anemia    Basal cell carcinoma    Depression    GERD (gastroesophageal reflux disease)    History of chicken pox    Joint pain    Weight gain     Past Surgical History:  Procedure Laterality Date   DILATION AND CURETTAGE OF UTERUS  2011   WISDOM TOOTH EXTRACTION      There were no vitals filed for this visit.   Subjective Assessment - 04/13/21 1103     Subjective Pt reports she had follow up with surgeon; pleased with progress.  She has had some more soreness in LE from exercises. She returns to work Aug 1.    Patient Stated Goals Get the left side stronger    Currently in Pain? Yes    Pain Location Back    Pain Orientation Left    Pain Descriptors / Indicators Aching                [facility] PT Assessment - 04/14/21 0001       Assessment   Medical Diagnosis radiculopathy, lumbar region    Referring Provider (PT) Glenford Peers, NP    Onset Date/Surgical Date 02/27/21    Next MD Visit PRN                           [facility] Adult PT Treatment/Exercise - 04/14/21 0001       Lumbar Exercises: Stretches   Passive Hamstring Stretch Right;Left;2 reps;20 seconds   hooklying with strap   Piriformis Stretch Left;2 reps;20  seconds   supine   Figure 4 Stretch 2 reps;20 seconds    Gastroc Stretch Left;4 reps;10 seconds    Other Lumbar Stretch Exercise sciatic nerve glide Lt x 10                         PT Long Term Goals - 04/06/21 1117       PT LONG TERM GOAL #1   Title Ind with HEP and progression.    Time 6    Period Weeks    Status New    Target Date 05/18/21      PT LONG TERM GOAL #2   Title Patient to demonstrate 4+/5 to 5/5 strength in her left ankle evertors and plantar flexors and left HS to improve functional activites.    Time 6    Period Weeks    Status New      PT LONG TERM GOAL #3   Title Patient to demonstrate improved core strength and stabilization with 5/5 MMT of Rt  hip flex and ext.    Time 6    Period Weeks    Status New      PT LONG TERM GOAL #4   Title Patient to report decreased left sciatic nerve tension in supine by >= 50% to help improve lumbar mobility.    Time 6    Period Weeks    Status New                   Plan - 04/13/21 1111     Clinical Impression Statement Pt reported plank was painful when performed at home; held for now from HEP.  Pt tolerated all other exercises without any increase in pain.  Time spent pt regarding aquatic exercises to trial in her pool; pt verbalized understanding.  Progressing well towards goals.    Rehab Potential Excellent    PT Frequency 2x / week    PT Duration 6 weeks    PT Treatment/Interventions ADLs/Self Care Home Management;Aquatic Therapy;Cryotherapy;Electrical Stimulation;Moist Heat;Therapeutic activities;Therapeutic exercise;Neuromuscular re-education;Balance training;Patient/family education;Manual techniques;Dry needling;Taping    PT Next Visit Plan update HEP as needed, aquatic HEP, continue core strengthening and calf strengthening    PT Home Exercise Plan [email or web address]    Consulted and Agree with Plan of Care Patient             Patient will benefit from skilled therapeutic intervention in  order to improve the following deficits and impairments:  Pain, Decreased strength, Impaired flexibility, Decreased range of motion  Visit Diagnosis: Muscle weakness (generalized)  Radiculopathy, lumbar region     Problem List Patient Active Problem List   Diagnosis Date Noted   At risk for side effect of medication 12/26/2020   Insulin resistance 08/31/2020   Iron deficiency 08/31/2020   Vitamin D deficiency 08/31/2020   At risk for impaired metabolic function 39/76/7341   Class 1 obesity without serious comorbidity with body mass index (BMI) of 32.0 to 32.9 in adult 04/05/2020   Well adult exam 01/13/2018   Weight gain 09/10/2017   Hair loss 09/10/2017    [name], PTA 04/14/21 8:01 AM   [place], [place], [address] Phone: [phone]   Fax:  [phone]  Name: Tressia PAYES MRN: [id number] Date of Birth: 1986/07/21

## 2021-04-25 ENCOUNTER — Encounter: Payer: 59 | Admitting: Physical Therapy

## 2021-04-27 ENCOUNTER — Ambulatory Visit (INDEPENDENT_AMBULATORY_CARE_PROVIDER_SITE_OTHER): Payer: 59 | Admitting: Physical Therapy

## 2021-04-27 ENCOUNTER — Encounter: Payer: Self-pay | Admitting: Physical Therapy

## 2021-04-27 ENCOUNTER — Other Ambulatory Visit: Payer: Self-pay

## 2021-04-27 DIAGNOSIS — M6281 Muscle weakness (generalized): Secondary | ICD-10-CM | POA: Diagnosis not present

## 2021-04-27 DIAGNOSIS — M5416 Radiculopathy, lumbar region: Secondary | ICD-10-CM | POA: Diagnosis not present

## 2021-04-27 NOTE — Therapy (Signed)
[place], [place], [address] Phone: [phone]   Fax:  [phone]  Physical Therapy Treatment  Patient Details  Name: Kimberly Orr MRN: [id number] Date of Birth: 1986/03/12 Referring Provider (PT): Glenford Peers, NP   Encounter Date: 04/27/2021   PT End of Session - 04/27/21 0935     Visit Number 4    Number of Visits 12    Date for PT Re-Evaluation 05/18/21    Authorization Type UMR    PT Start Time 0932    PT Stop Time 1017    PT Time Calculation (min) 45 min    Activity Tolerance Patient tolerated treatment well    Behavior During Therapy [facility] for tasks assessed/performed             Past Medical History:  Diagnosis Date   Anemia    Basal cell carcinoma    Depression    GERD (gastroesophageal reflux disease)    History of chicken pox    Joint pain    Weight gain     Past Surgical History:  Procedure Laterality Date   DILATION AND CURETTAGE OF UTERUS  2011   WISDOM TOOTH EXTRACTION      There were no vitals filed for this visit.   Subjective Assessment - 04/27/21 0936     Subjective Went to the beach last week so did a lot of walking. Did the pool exercises and has worked back up to doing some planks.    Pertinent History L5/S1 lami    Patient Stated Goals Get the left side stronger    Currently in Pain? No/denies                [facility] PT Assessment - 04/27/21 0001       Strength   Overall Strength Comments Lt ankle eversion 5/5; pt able to do 10 reps PF on left but not full ROM yet                           [facility] Adult PT Treatment/Exercise - 04/27/21 0001       Lumbar Exercises: Aerobic   Elliptical L2 x 5 min      Lumbar Exercises: Standing   Other Standing Lumbar Exercises green band around thighs side stepping x 10 each way      Lumbar Exercises: Seated   Other Seated Lumbar Exercises seated sciatic nerve tension x 10 with  upright trunk      Lumbar Exercises: Supine   Ab Set 5 reps    AB Set Limitations 5 sec hold    Bent Knee Raise 10 reps    Bridge 10 reps;5 seconds   cues for ab and glute set   Bridge Limitations 2 sets; second with green band      Lumbar Exercises: Quadruped   Straight Leg Raise 5 reps;5 seconds    Straight Leg Raises Limitations cues to keep pelvis level    Opposite Arm/Leg Raise 5 reps;Right arm/Left leg;Left arm/Right leg    Opposite Arm/Leg Raise Limitations challenging so went to just legs      Manual Therapy   Manual Therapy Soft tissue mobilization    Soft tissue mobilization IASTM to left post LE along L5 nerve distribution      Ankle Exercises: Standing   Heel Raises Right;Left;10 reps    Other Standing Ankle Exercises Bosu: step ups x 10 ea side; x 10  with opp KTC x 10 ea; rebounder x 10 ea      Ankle Exercises: Stretches   Gastroc Stretch 1 rep;20 seconds                    PT Education - 04/27/21 1030     Education Details HEP progressed    Person(s) Educated Patient    Methods Explanation;Demonstration;Handout    Comprehension Verbalized understanding;Returned demonstration                 PT Long Term Goals - 04/27/21 1031       PT LONG TERM GOAL #1   Title Ind with HEP and progression.    Status On-going      PT LONG TERM GOAL #2   Title Patient to demonstrate 4+/5 to 5/5 strength in her left ankle evertors and plantar flexors and left HS to improve functional activites.    Status Partially Met      PT LONG TERM GOAL #4   Title Patient to report decreased left sciatic nerve tension in supine by >= 50% to help improve lumbar mobility.    Status On-going                   Plan - 04/27/21 1038     Clinical Impression Statement Patient is progressing well with ankle and core strength. Ankle plantar flexion is still weak, but eversion is 5/5. Quadriped core is still challenging. She will benefit from core strenthening in  functional positions for work. Still having postive neural tension. She responded well to IASTM with improved mobility after.    PT Frequency 2x / week    PT Duration 6 weeks    PT Treatment/Interventions ADLs/Self Care Home Management;Aquatic Therapy;Cryotherapy;Electrical Stimulation;Moist Heat;Therapeutic activities;Therapeutic exercise;Neuromuscular re-education;Balance training;Patient/family education;Manual techniques;Dry needling;Taping    PT Next Visit Plan continue core strengthening progressing to functional positions for RTW and calf strengthening,    PT Home Exercise Plan [id number]    Consulted and Agree with Plan of Care Patient             Patient will benefit from skilled therapeutic intervention in order to improve the following deficits and impairments:  Pain, Decreased strength, Impaired flexibility, Decreased range of motion  Visit Diagnosis: Muscle weakness (generalized)  Radiculopathy, lumbar region     Problem List Patient Active Problem List   Diagnosis Date Noted   At risk for side effect of medication 12/26/2020   Insulin resistance 08/31/2020   Iron deficiency 08/31/2020   Vitamin D deficiency 08/31/2020   At risk for impaired metabolic function 51/11/5850   Class 1 obesity without serious comorbidity with body mass index (BMI) of 32.0 to 32.9 in adult 04/05/2020   Well adult exam 01/13/2018   Weight gain 09/10/2017   Hair loss 09/10/2017   Madelyn Flavors PT 04/27/2021, 10:42 AM  [facility] [place], [place], [address] Phone: [phone]   Fax:  [phone]  Name: Kimberly Orr MRN: [id number] Date of Birth: Jan 22, 1986

## 2021-04-27 NOTE — Patient Instructions (Signed)
Access Code: [email or web address] URL: [email or web address] Date: 04/27/2021 Prepared by: Almyra Free  Exercises Supine Sciatic Nerve Glide - 2 x daily - 7 x weekly - 1 sets - 10 reps - 5 sec hold Supine Bridge - 2 x daily - 7 x weekly - 1-3 sets - 10 reps Standard Plank - 2 x daily - 7 x weekly - 1 sets - 5 reps - max hold Gastroc Stretch on Wall - 2 x daily - 7 x weekly - 1 sets - 10 reps Prone Hip Extension with Pillow Under Abdomen - 2 x daily - 7 x weekly - 3 sets - 10 reps Quadruped Alternating Leg Extensions - 1 x daily - 7 x weekly - 1-2 sets - 10 reps - 5 sec hold Seated Sciatic Tensioner - 2 x daily - 7 x weekly - 1 sets - 10 reps - 5 second hold Seated Piriformis Stretch with Trunk Bend - 2 x daily - 7 x weekly - 1 sets - 3 reps - 30-60 sec hold

## 2021-05-02 ENCOUNTER — Ambulatory Visit (INDEPENDENT_AMBULATORY_CARE_PROVIDER_SITE_OTHER): Payer: 59 | Admitting: Physical Therapy

## 2021-05-02 ENCOUNTER — Other Ambulatory Visit: Payer: Self-pay

## 2021-05-02 ENCOUNTER — Encounter: Payer: Self-pay | Admitting: Physical Therapy

## 2021-05-02 DIAGNOSIS — M6281 Muscle weakness (generalized): Secondary | ICD-10-CM

## 2021-05-02 DIAGNOSIS — M5416 Radiculopathy, lumbar region: Secondary | ICD-10-CM | POA: Diagnosis not present

## 2021-05-02 NOTE — Therapy (Signed)
[place], [place], [address] Phone: [phone]   Fax:  [phone]  Physical Therapy Treatment  Patient Details  Name: Kimberly Orr MRN: [id number] Date of Birth: 10-15-1986 Referring Provider (PT): Glenford Peers, NP   Encounter Date: 05/02/2021   PT End of Session - 05/02/21 0939     Visit Number 5    Number of Visits 12    Date for PT Re-Evaluation 05/18/21    Authorization Type UMR    PT Start Time 0934    PT Stop Time 1012    PT Time Calculation (min) 38 min    Activity Tolerance Patient tolerated treatment well    Behavior During Therapy [facility] for tasks assessed/performed             Past Medical History:  Diagnosis Date   Anemia    Basal cell carcinoma    Depression    GERD (gastroesophageal reflux disease)    History of chicken pox    Joint pain    Weight gain     Past Surgical History:  Procedure Laterality Date   DILATION AND CURETTAGE OF UTERUS  2011   WISDOM TOOTH EXTRACTION      There were no vitals filed for this visit.   Subjective Assessment - 05/02/21 0939     Subjective Pt reports that her legs were sore the day after last session. The massage work with tool really helped.  The seated neural stretch is good.    Patient Stated Goals Get the left side stronger    Currently in Pain? No/denies    Pain Score 0-No pain                [facility] PT Assessment - 05/02/21 0001       Assessment   Medical Diagnosis radiculopathy, lumbar region    Referring Provider (PT) Glenford Peers, NP    Onset Date/Surgical Date 02/27/21    Hand Dominance Right    Next MD Visit PRN      Strength   Strength Assessment Site Hip    Right/Left Hip Left    Left Hip Extension 3+/5              [facility] Adult PT Treatment/Exercise - 05/02/21 0001       Lumbar Exercises: Stretches   Passive Hamstring Stretch Right;Left;2 reps;20 seconds   hooklying   Figure 4  Stretch 2 reps;30 seconds    Other Lumbar Stretch Exercise sciatic nerve glide Lt/Rt x 10      Lumbar Exercises: Aerobic   Elliptical L1: 4 min      Lumbar Exercises: Standing   Functional Squats 15 reps   buttocks to black mat, holding 5# KB   Other Standing Lumbar Exercises anti-rotation side step with out/in from core with green band x 10 each side.      Lumbar Exercises: Supine   Single Leg Bridge 5 reps;2 seconds   up with 2 LE, then lifting one leg.     Lumbar Exercises: Prone   Straight Leg Raise 10 reps   1 set RLE, 2 sets LLE     Lumbar Exercises: Quadruped   Opposite Arm/Leg Raise Right arm/Left leg;Left arm/Right leg;10 reps;4 seconds    Other Quadruped Lumbar Exercises primal pushup x 10 sec, x 20 sec, 20 sec      Manual Therapy   Soft tissue mobilization IASTM/STM to left post LE along L5 nerve distribution  PT Long Term Goals - 05/02/21 0941       PT LONG TERM GOAL #1   Title Ind with HEP and progression.    Time 6    Period Weeks    Status On-going      PT LONG TERM GOAL #2   Title Patient to demonstrate 4+/5 to 5/5 strength in her left ankle evertors and plantar flexors and left HS to improve functional activites.    Time 6    Period Weeks    Status Partially Met      PT LONG TERM GOAL #3   Title Patient to demonstrate improved core strength and stabilization with 5/5 MMT of Rt hip flex and ext.    Time 6    Period Weeks    Status On-going      PT LONG TERM GOAL #4   Title Patient to report decreased left sciatic nerve tension in supine by >= 50% to help improve lumbar mobility.    Time 6    Period Weeks    Status On-going                   Plan - 05/02/21 1014     Clinical Impression Statement Continued weakness in Lt hip extensors.  Improved form and balance with quadruped opp arm/leg.  Continued focus on core strengthening in a variety of positions.  She tolerated exercises well, without increased symptoms.  Progressing well towards goals.    PT Frequency 2x / week    PT Duration 6 weeks    PT Treatment/Interventions ADLs/Self Care Home Management;Aquatic Therapy;Cryotherapy;Electrical Stimulation;Moist Heat;Therapeutic activities;Therapeutic exercise;Neuromuscular re-education;Balance training;Patient/family education;Manual techniques;Dry needling;Taping    PT Next Visit Plan continue core strengthening progressing to functional positions for RTW and calf strengthening,    PT Home Exercise Plan [email or web address]    Consulted and Agree with Plan of Care Patient             Patient will benefit from skilled therapeutic intervention in order to improve the following deficits and impairments:  Pain, Decreased strength, Impaired flexibility, Decreased range of motion  Visit Diagnosis: Muscle weakness (generalized)  Radiculopathy, lumbar region     Problem List Patient Active Problem List   Diagnosis Date Noted   At risk for side effect of medication 12/26/2020   Insulin resistance 08/31/2020   Iron deficiency 08/31/2020   Vitamin D deficiency 08/31/2020   At risk for impaired metabolic function 04/79/9872   Class 1 obesity without serious comorbidity with body mass index (BMI) of 32.0 to 32.9 in adult 04/05/2020   Well adult exam 01/13/2018   Weight gain 09/10/2017   Hair loss 09/10/2017   [name], PTA 05/02/21 10:17 AM   [place], [place], [address] Phone: [phone]   Fax:  [phone]  Name: Kimberly Orr MRN: [id number] Date of Birth: 28-Nov-1985

## 2021-05-04 ENCOUNTER — Other Ambulatory Visit: Payer: Self-pay

## 2021-05-04 ENCOUNTER — Ambulatory Visit (INDEPENDENT_AMBULATORY_CARE_PROVIDER_SITE_OTHER): Payer: 59 | Admitting: Physical Therapy

## 2021-05-04 DIAGNOSIS — M6281 Muscle weakness (generalized): Secondary | ICD-10-CM | POA: Diagnosis not present

## 2021-05-04 DIAGNOSIS — M5416 Radiculopathy, lumbar region: Secondary | ICD-10-CM | POA: Diagnosis not present

## 2021-05-04 NOTE — Therapy (Signed)
[place], [place], [address] Phone: [phone]   Fax:  [phone]  Physical Therapy Treatment  Patient Details  Name: Kimberly Orr MRN: [id number] Date of Birth: [birth year] Referring Provider (PT): Glenford Peers, NP   Encounter Date: 05/04/2021   PT End of Session - 05/04/21 0939     Visit Number 6    Number of Visits 12    Date for PT Re-Evaluation 05/18/21    Authorization Type UMR    PT Start Time 0935   pt arrived late   PT Stop Time 1013    PT Time Calculation (min) 38 min    Activity Tolerance Patient tolerated treatment well    Behavior During Therapy [facility] for tasks assessed/performed             Past Medical History:  Diagnosis Date   Anemia    Basal cell carcinoma    Depression    GERD (gastroesophageal reflux disease)    History of chicken pox    Joint pain    Weight gain     Past Surgical History:  Procedure Laterality Date   DILATION AND CURETTAGE OF UTERUS  2011   WISDOM TOOTH EXTRACTION      There were no vitals filed for this visit.   Subjective Assessment - 05/04/21 0939     Subjective Pt reports no new changes since last visit. She had called the surgeon and has been cleared to do some lumbar rotation, "but don't go play a round of golf, or anything repetitive".    Pertinent History L5/S1 lami    Patient Stated Goals Get the left side stronger    Currently in Pain? No/denies    Pain Score 0-No pain                [facility] PT Assessment - 05/04/21 0001       Assessment   Medical Diagnosis radiculopathy, lumbar region    Referring Provider (PT) Glenford Peers, NP    Onset Date/Surgical Date 02/27/21    Hand Dominance Right    Next MD Visit PRN              [facility] Adult PT Treatment/Exercise - 05/04/21 0001       Lumbar Exercises: Stretches   Passive Hamstring Stretch Right;Left;2 reps;20 seconds   hooklying   Piriformis Stretch  Right;Left;2 reps;20 seconds   supine   Gastroc Stretch 2 reps;20 seconds   incline board     Lumbar Exercises: Aerobic   Elliptical L2: 2.5 min    UBE (Upper Arm Bike) L5: forward/backward, standing on Bosu - with 2 feet, then SLS, with core engaged.      Lumbar Exercises: Machines for Strengthening   Other Lumbar Machine Exercise seated row with cables - 2 plates x 5 reps, 3 plates x 10 reps      Lumbar Exercises: Standing   Heel Raises 10 reps   up with 2, eccentric lowering with LLE   Functional Squats 15 reps   buttocks to black mat, holding 8# weight   Other Standing Lumbar Exercises forward/backward marching with 3# wt overhead x 15 ft x 2    Other Standing Lumbar Exercises side to side lunges with slight forward trunk x 10.      Lumbar Exercises: Seated   Other Seated Lumbar Exercises seated sciatic nerve tension x 10      Lumbar Exercises: Supine   Dead Bug 10 reps;2  seconds   5 reps each side.   Dead Bug Limitations then bilat arms over head wiht opp leg ext x 5 each side, 2 sets      Lumbar Exercises: Sidelying   Other Sidelying Lumbar Exercises modified side plank x 5 sec x 10 sec each side     Lumbar Exercises: Quadruped   Plank high plank with knee taps x 10   Other Quadruped Lumbar Exercises primal pushup  x 20 sec, 2reps                         PT Long Term Goals - 05/02/21 0941       PT LONG TERM GOAL #1   Title Ind with HEP and progression.    Time 6    Period Weeks    Status On-going      PT LONG TERM GOAL #2   Title Patient to demonstrate 4+/5 to 5/5 strength in her left ankle evertors and plantar flexors and left HS to improve functional activites.    Time 6    Period Weeks    Status Partially Met      PT LONG TERM GOAL #3   Title Patient to demonstrate improved core strength and stabilization with 5/5 MMT of Rt hip flex and ext.    Time 6    Period Weeks    Status On-going      PT LONG TERM GOAL #4   Title Patient to report  decreased left sciatic nerve tension in supine by >= 50% to help improve lumbar mobility.    Time 6    Period Weeks    Status On-going                   Plan - 05/04/21 1011     Clinical Impression Statement Pt tolerated new core exercises well, reporting some fatigue but no pain.  She had some cramping in Lt calf after heel raises.  Will continue to progress towards LTGs.    Rehab Potential Excellent    PT Frequency 2x / week    PT Duration 6 weeks    PT Treatment/Interventions ADLs/Self Care Home Management;Aquatic Therapy;Cryotherapy;Electrical Stimulation;Moist Heat;Therapeutic activities;Therapeutic exercise;Neuromuscular re-education;Balance training;Patient/family education;Manual techniques;Dry needling;Taping    PT Next Visit Plan continue core strengthening progressing to functional positions for RTW and calf strengthening,    PT Home Exercise Plan [email or web address]    Consulted and Agree with Plan of Care Patient             Patient will benefit from skilled therapeutic intervention in order to improve the following deficits and impairments:  Pain, Decreased strength, Impaired flexibility, Decreased range of motion  Visit Diagnosis: Muscle weakness (generalized)  Radiculopathy, lumbar region     Problem List Patient Active Problem List   Diagnosis Date Noted   At risk for side effect of medication 12/26/2020   Insulin resistance 08/31/2020   Iron deficiency 08/31/2020   Vitamin D deficiency 08/31/2020   At risk for impaired metabolic function 48/18/5631   Class 1 obesity without serious comorbidity with body mass index (BMI) of 32.0 to 32.9 in adult 04/05/2020   Well adult exam 01/13/2018   Weight gain 09/10/2017   Hair loss 09/10/2017   [name], PTA 05/04/21 10:15 AM   [place], [place], [address] Phone: [phone]   Fax:  [phone]  Name: Kimberly Orr MRN: [id number]  Date of Birth: Jul 30, 1986

## 2021-05-09 ENCOUNTER — Other Ambulatory Visit: Payer: Self-pay

## 2021-05-09 ENCOUNTER — Encounter: Payer: Self-pay | Admitting: Physical Therapy

## 2021-05-09 ENCOUNTER — Ambulatory Visit (INDEPENDENT_AMBULATORY_CARE_PROVIDER_SITE_OTHER): Payer: 59 | Admitting: Physical Therapy

## 2021-05-09 DIAGNOSIS — M6281 Muscle weakness (generalized): Secondary | ICD-10-CM

## 2021-05-09 DIAGNOSIS — M5416 Radiculopathy, lumbar region: Secondary | ICD-10-CM | POA: Diagnosis not present

## 2021-05-09 NOTE — Therapy (Signed)
[place], [place], [address] Phone: [phone]   Fax:  [phone]  Physical Therapy Treatment  Patient Details  Name: Kimberly Orr MRN: [id number] Date of Birth: Feb 23, 1986 Referring Provider (PT): Glenford Peers, NP   Encounter Date: 05/09/2021   PT End of Session - 05/09/21 0940     Visit Number 7    Number of Visits 12    Date for PT Re-Evaluation 05/18/21    Authorization Type UMR    PT Start Time 0932    PT Stop Time 1010    PT Time Calculation (min) 38 min    Activity Tolerance Patient tolerated treatment well    Behavior During Therapy [facility] for tasks assessed/performed             Past Medical History:  Diagnosis Date   Anemia    Basal cell carcinoma    Depression    GERD (gastroesophageal reflux disease)    History of chicken pox    Joint pain    Weight gain     Past Surgical History:  Procedure Laterality Date   DILATION AND CURETTAGE OF UTERUS  2011   WISDOM TOOTH EXTRACTION      There were no vitals filed for this visit.   Subjective Assessment - 05/09/21 0942     Subjective Pt reports she feels like she is getting stronger.  She has been doing the exercises from last session since she was last here.    Pertinent History L5/S1 lami    Patient Stated Goals Get the left side stronger    Currently in Pain? No/denies    Pain Score 0-No pain                [facility] PT Assessment - 05/09/21 0001       Assessment   Medical Diagnosis radiculopathy, lumbar region    Referring Provider (PT) Glenford Peers, NP    Onset Date/Surgical Date 02/27/21    Hand Dominance Right    Next MD Visit PRN      Strength   Overall Strength Comments Lt hamstring 4-/5    Right/Left Hip Right;Left    Right Hip Extension 4-/5    Left Hip Extension 4+/5              [facility] Adult PT Treatment/Exercise - 05/09/21 0001       Lumbar Exercises: Stretches   Passive  Hamstring Stretch Right;Left;2 reps;30 seconds   supine and seated     Lumbar Exercises: Aerobic   Elliptical L2: 4 min      Lumbar Exercises: Standing   Heel Raises Limitations able to complete 12 single leg heel raises on LLE. 20 on RLE.    Other Standing Lumbar Exercises forward/backward marching with 3# wt overhead x 15 ft x 2;  pre-founder training ( hip hinge with arms forward and back engaged) x 2 reps    Other Standing Lumbar Exercises forward step ups on 12" with BUE on rails x 5 rpes each leg.      Lumbar Exercises: Seated   Other Seated Lumbar Exercises stool scoots with LLE x 50 ft on tile, 5 ft on carpet (too hard).      Lumbar Exercises: Quadruped   Other Quadruped Lumbar Exercises primal pushup x 20 sec x 2    Other Quadruped Lumbar Exercises single leg high kneeling with gentle wood chop with 2# ball x 10 each side, then kneeling (BLE) with  wood chop 5 reps each side.            Prone hip ext x 10 each leg.   childs pose x 2 reps of 15 sec,   Single knee to chest x 20 sec each, then double knee to chest x 15 sec.      PT Long Term Goals - 05/02/21 0941       PT LONG TERM GOAL #1   Title Ind with HEP and progression.    Time 6    Period Weeks    Status On-going      PT LONG TERM GOAL #2   Title Patient to demonstrate 4+/5 to 5/5 strength in her left ankle evertors and plantar flexors and left HS to improve functional activites.    Time 6    Period Weeks    Status Partially Met      PT LONG TERM GOAL #3   Title Patient to demonstrate improved core strength and stabilization with 5/5 MMT of Rt hip flex and ext.    Time 6    Period Weeks    Status On-going      PT LONG TERM GOAL #4   Title Patient to report decreased left sciatic nerve tension in supine by >= 50% to help improve lumbar mobility.    Time 6    Period Weeks    Status On-going                   Plan - 05/09/21 1010     Clinical Impression Statement Pt continues with some  mild weakness in LLE, including calf and hamstring.  She reported some cramping in Lt calf with single leg heel raises; able to complete 12.  Pt tolerated pre-founder foundation training for low back strengthening - without pain, only fatigue.  Pt is progressing well towards remaining goals.  Will continue to progress to assist with return  to work.    Rehab Potential Excellent    PT Frequency 2x / week    PT Duration 6 weeks    PT Treatment/Interventions ADLs/Self Care Home Management;Aquatic Therapy;Cryotherapy;Electrical Stimulation;Moist Heat;Therapeutic activities;Therapeutic exercise;Neuromuscular re-education;Balance training;Patient/family education;Manual techniques;Dry needling;Taping    PT Next Visit Plan continue core strengthening progressing to functional positions for RTW and calf strengthening,    PT Home Exercise Plan [email or web address]    Consulted and Agree with Plan of Care Patient             Patient will benefit from skilled therapeutic intervention in order to improve the following deficits and impairments:  Pain, Decreased strength, Impaired flexibility, Decreased range of motion  Visit Diagnosis: Muscle weakness (generalized)  Radiculopathy, lumbar region     Problem List Patient Active Problem List   Diagnosis Date Noted   At risk for side effect of medication 12/26/2020   Insulin resistance 08/31/2020   Iron deficiency 08/31/2020   Vitamin D deficiency 08/31/2020   At risk for impaired metabolic function 75/43/6067   Class 1 obesity without serious comorbidity with body mass index (BMI) of 32.0 to 32.9 in adult 04/05/2020   Well adult exam 01/13/2018   Weight gain 09/10/2017   Hair loss 09/10/2017   Kimberly Orr, PTA 05/09/21 11:24 AM   [place], [place], [address] Phone: [phone]   Fax:  [phone]  Name: Kimberly Orr MRN: [id number] Date of Birth:  30-Dec-1985

## 2021-05-11 ENCOUNTER — Encounter: Payer: 59 | Admitting: Physical Therapy

## 2021-05-15 ENCOUNTER — Other Ambulatory Visit: Payer: 59

## 2021-05-16 ENCOUNTER — Encounter: Payer: 59 | Admitting: Physical Therapy

## 2021-05-18 ENCOUNTER — Other Ambulatory Visit: Payer: Self-pay

## 2021-05-18 ENCOUNTER — Ambulatory Visit (INDEPENDENT_AMBULATORY_CARE_PROVIDER_SITE_OTHER): Payer: 59 | Admitting: Physical Therapy

## 2021-05-18 DIAGNOSIS — M5416 Radiculopathy, lumbar region: Secondary | ICD-10-CM | POA: Diagnosis not present

## 2021-05-18 DIAGNOSIS — M6281 Muscle weakness (generalized): Secondary | ICD-10-CM

## 2021-05-18 NOTE — Therapy (Addendum)
[place], [place], [address] Phone: [phone]   Fax:  [phone]  Physical Therapy Treatment and Discharge Summary  Patient Details  Name: Kimberly Orr MRN: [id number] Date of Birth: 1986/03/11 Referring Provider (PT): Glenford Peers, NP   Encounter Date: 05/18/2021   PT End of Session - 05/18/21 1016     Visit Number 8    Number of Visits 12    Date for PT Re-Evaluation 05/18/21    Authorization Type UMR    PT Start Time 0937    PT Stop Time 1015    PT Time Calculation (min) 38 min    Activity Tolerance Patient tolerated treatment well    Behavior During Therapy [facility] for tasks assessed/performed             Past Medical History:  Diagnosis Date   Anemia    Basal cell carcinoma    Depression    GERD (gastroesophageal reflux disease)    History of chicken pox    Joint pain    Weight gain     Past Surgical History:  Procedure Laterality Date   DILATION AND CURETTAGE OF UTERUS  2011   WISDOM TOOTH EXTRACTION      There were no vitals filed for this visit.   Subjective Assessment - 05/18/21 0938     Subjective Pt reports she was running errands yesterday; a little more sore today.  she has been doing one of the exercises from foundation training since last visit with good tolerance.    Pertinent History L5/S1 lami    Patient Stated Goals Get the left side stronger    Currently in Pain? Yes    Pain Score 2     Pain Location Back    Pain Orientation Left    Pain Descriptors / Indicators Sore    Aggravating Factors  prolonged sitting    Pain Relieving Factors stretches.                [facility] PT Assessment - 05/18/21 0001       Assessment   Medical Diagnosis radiculopathy, lumbar region    Referring Provider (PT) Glenford Peers, NP    Onset Date/Surgical Date 02/27/21    Hand Dominance Right    Next MD Visit PRN      Strength   Overall Strength Comments Lt  hamstring 4+/5    Right Hip Extension 4+/5    Left Hip Extension 5/5              [facility] Adult PT Treatment/Exercise - 05/18/21 0001       Lumbar Exercises: Stretches   Single Knee to Chest Stretch Right;Left;1 rep;10 seconds    Double Knee to Chest Stretch 2 reps;20 seconds    Piriformis Stretch Right;Left;2 reps;20 seconds   supine     Lumbar Exercises: Aerobic   Elliptical L2: 4 min for warm up      Lumbar Exercises: Standing   Other Standing Lumbar Exercises Warrior one holding noodle overhead with back heel raises x 10 each side.  Warrior II, with small pulses (10) x 2 reps each leg. warrior III (per pool HEP) sliding arms/noodle on elevated table x 10 - all exercises 2 sets.    Other Standing Lumbar Exercises founder pose - hip hinge, knees bent, arms reaching to elevated table x 20 sec x 2 reps      Manual Therapy   Soft tissue mobilization IASTM / STM  to Lt calf and hamstring to decrease fascial restrictions and improve mobility.                    PT Education - 05/18/21 1012     Education Details HEP. green band issued.    Person(s) Educated Patient    Methods Explanation;Demonstration;Handout;Verbal cues    Comprehension Verbalized understanding;Returned demonstration                 PT Long Term Goals - 05/18/21 1021       PT LONG TERM GOAL #1   Title Ind with HEP and progression.    Time 6    Period Weeks    Status Partially Met      PT LONG TERM GOAL #2   Title Patient to demonstrate 4+/5 to 5/5 strength in her left ankle evertors and plantar flexors and left HS to improve functional activites.    Time 6    Period Weeks    Status Partially Met      PT LONG TERM GOAL #3   Title Patient to demonstrate improved core strength and stabilization with 5/5 MMT of Rt hip flex and ext.    Time 6    Period Weeks    Status Partially Met      PT LONG TERM GOAL #4   Title Patient to report decreased left sciatic nerve tension in supine by >=  50% to help improve lumbar mobility.    Time 6    Period Weeks    Status Achieved                   Plan - 05/18/21 0958     Clinical Impression Statement Introduced pt to warrior series with pool noodle for her to incorporate into her aquatic exercises; good tolerance to all. Good improvement noted in LE strength.  LLE continues to fatigue quickly and she reported occasional cramping in calf and hamstring.  Palpable tightness found in lateral proximal gastroc and mid-hamstrings; reduced with IASTM. Pt has partially met her goals.  Pt returns to work on Monday; requests to hold therapy 2 wks to assess readiness to d/c to HEP.  If returning, [facility].    Rehab Potential Excellent    PT Frequency 2x / week    PT Duration 6 weeks    PT Treatment/Interventions ADLs/Self Care Home Management;Aquatic Therapy;Cryotherapy;Electrical Stimulation;Moist Heat;Therapeutic activities;Therapeutic exercise;Neuromuscular re-education;Balance training;Patient/family education;Manual techniques;Dry needling;Taping    PT Next Visit Plan continue core strengthening progressing to functional positions for RTW and calf strengthening,    PT Home Exercise Plan [email or web address]    Consulted and Agree with Plan of Care Patient             Patient will benefit from skilled therapeutic intervention in order to improve the following deficits and impairments:  Pain, Decreased strength, Impaired flexibility, Decreased range of motion  Visit Diagnosis: Muscle weakness (generalized)  Radiculopathy, lumbar region     Problem List Patient Active Problem List   Diagnosis Date Noted   At risk for side effect of medication 12/26/2020   Insulin resistance 08/31/2020   Iron deficiency 08/31/2020   Vitamin D deficiency 08/31/2020   At risk for impaired metabolic function 08/31/2020   Class 1 obesity without serious comorbidity with body mass index (BMI) of 32.0 to 32.9 in adult  04/05/2020   Well adult exam 01/13/2018   Weight gain 09/10/2017   Hair loss 09/10/2017  Kimberly Orr, PTA 05/18/21 1:27 PM   [facility] [place], [place], [address] Phone: [phone]   Fax:  [phone]  Name: Kimberly Orr MRN: [id number] Date of Birth: [birth year]  PHYSICAL THERAPY DISCHARGE SUMMARY  Visits from Start of Care: 8  Current functional level related to goals / functional outcomes: See above   Remaining deficits: See above   Education / Equipment: HEP   Patient agrees to discharge. Patient goals were partially met. Patient is being discharged due to being pleased with the current functional level.  Madelyn Flavors, PT 06/12/21 6:58 PM  [facility] [place], [place] [address]  [phone] (office) [phone] (fax)

## 2021-05-18 NOTE — Patient Instructions (Signed)
Access Code: [email or web address] URL: [email or web address] Date: 05/18/2021 Prepared by: [place]  Exercises Quadruped Leg Extension with Resistance - 1 x daily - 3 x weekly - 1-2 sets - 10 reps Standard Plank - 2 x daily - 3 x weekly - 1 sets - 5 reps - max hold Supine Bridge - 1 x daily - 3 x weekly - 1-3 sets - 10 reps - 5 hold Supine Sciatic Nerve Glide - 2 x daily - 7 x weekly - 1 sets - 10 reps - 5 sec hold Gastroc Stretch on Wall - 2 x daily - 7 x weekly - 1 sets - 10 reps Seated Sciatic Tensioner - 2 x daily - 7 x weekly - 1 sets - 10 reps - 5 second hold Seated Piriformis Stretch with Trunk Bend - 2 x daily - 7 x weekly - 1 sets - 3 reps - 30-60 sec hold Warrior I in [employer] with [employer] - 1 x daily - 7 x weekly - 3 sets - 10 reps Warrior II in [employer] with [employer] - 1 x daily - 7 x weekly - 3 sets - 3 reps Warrior III in [employer] with [employer] - 1 x daily - 7 x weekly - 3 sets - 10 reps

## 2021-05-30 ENCOUNTER — Other Ambulatory Visit (INDEPENDENT_AMBULATORY_CARE_PROVIDER_SITE_OTHER): Payer: 59

## 2021-05-30 ENCOUNTER — Other Ambulatory Visit: Payer: Self-pay

## 2021-05-30 DIAGNOSIS — Z0000 Encounter for general adult medical examination without abnormal findings: Secondary | ICD-10-CM | POA: Diagnosis not present

## 2021-05-30 LAB — COMPREHENSIVE METABOLIC PANEL
ALT: 10 U/L (ref 0–35)
AST: 13 U/L (ref 0–37)
Albumin: 4.4 g/dL (ref 3.5–5.2)
Alkaline Phosphatase: 73 U/L (ref 39–117)
BUN: 10 mg/dL (ref 6–23)
CO2: 24 mEq/L (ref 19–32)
Calcium: 9.3 mg/dL (ref 8.4–10.5)
Chloride: 105 mEq/L (ref 96–112)
Creatinine, Ser: 0.8 mg/dL (ref 0.40–1.20)
GFR: 94.13 mL/min (ref >60.00)
Glucose, Bld: 92 mg/dL (ref 70–99)
Potassium: 4.3 mEq/L (ref 3.5–5.1)
Sodium: 138 mEq/L (ref 135–145)
Total Bilirubin: 0.5 mg/dL (ref 0.2–1.2)
Total Protein: 6.5 g/dL (ref 6.0–8.3)

## 2021-05-30 LAB — LIPID PANEL
Cholesterol: 153 mg/dL (ref 0–200)
HDL: 53.4 mg/dL (ref >39.00)
LDL Cholesterol: 87 mg/dL (ref 0–99)
NonHDL: 99.5
Total CHOL/HDL Ratio: 3
Triglycerides: 65 mg/dL (ref 0.0–149.0)
VLDL: 13 mg/dL (ref 0.0–40.0)

## 2021-05-30 LAB — CBC
HCT: 40.4 % (ref 36.0–46.0)
Hemoglobin: 13.3 g/dL (ref 12.0–15.0)
MCHC: 33 g/dL (ref 30.0–36.0)
MCV: 81.8 fl (ref 78.0–100.0)
Platelets: 338 10*3/uL (ref 150.0–400.0)
RBC: 4.94 Mil/uL (ref 3.87–5.11)
RDW: 13.1 % (ref 11.5–15.5)
WBC: 8 10*3/uL (ref 4.0–10.5)

## 2021-06-05 ENCOUNTER — Other Ambulatory Visit: Payer: Self-pay | Admitting: Family Medicine

## 2021-06-05 ENCOUNTER — Other Ambulatory Visit (HOSPITAL_BASED_OUTPATIENT_CLINIC_OR_DEPARTMENT_OTHER): Payer: Self-pay

## 2021-06-05 MED ORDER — FLUCONAZOLE 150 MG PO TABS
150.0000 mg | ORAL_TABLET | Freq: Once | ORAL | 3 refills | Status: AC
  Filled 2021-06-05: qty 1, 1d supply, fill #0

## 2021-06-05 MED ORDER — CEFADROXIL 500 MG PO CAPS
500.0000 mg | ORAL_CAPSULE | Freq: Two times a day (BID) | ORAL | 0 refills | Status: DC
  Filled 2021-06-05: qty 14, 7d supply, fill #0

## 2021-06-05 NOTE — Progress Notes (Signed)
Having cellulitis after an insect bite. Prescription sent to pharmacy.

## 2021-07-17 ENCOUNTER — Ambulatory Visit: Payer: 59 | Admitting: Obstetrics & Gynecology

## 2021-07-28 ENCOUNTER — Ambulatory Visit
Admission: RE | Admit: 2021-07-28 | Discharge: 2021-07-28 | Disposition: A | Discharge status: Home / Self Care | Payer: 59 | Source: Ambulatory Visit | Attending: Obstetrics and Gynecology | Admitting: Obstetrics and Gynecology

## 2021-07-28 ENCOUNTER — Other Ambulatory Visit: Payer: Self-pay

## 2021-07-28 DIAGNOSIS — N6310 Unspecified lump in the right breast, unspecified quadrant: Secondary | ICD-10-CM

## 2021-07-28 DIAGNOSIS — R922 Inconclusive mammogram: Secondary | ICD-10-CM | POA: Diagnosis not present

## 2021-07-28 DIAGNOSIS — N6311 Unspecified lump in the right breast, upper outer quadrant: Secondary | ICD-10-CM | POA: Diagnosis not present

## 2021-08-07 ENCOUNTER — Other Ambulatory Visit: Payer: Self-pay

## 2021-08-07 ENCOUNTER — Encounter: Payer: Self-pay | Admitting: Obstetrics & Gynecology

## 2021-08-07 ENCOUNTER — Ambulatory Visit (INDEPENDENT_AMBULATORY_CARE_PROVIDER_SITE_OTHER): Payer: 59 | Admitting: Obstetrics & Gynecology

## 2021-08-07 VITALS — BP 128/87 | HR 69 | Ht 65.0 in | Wt 200.0 lb

## 2021-08-07 DIAGNOSIS — Z01419 Encounter for gynecological examination (general) (routine) without abnormal findings: Secondary | ICD-10-CM

## 2021-08-07 DIAGNOSIS — Z6832 Body mass index (BMI) 32.0-32.9, adult: Secondary | ICD-10-CM | POA: Diagnosis not present

## 2021-08-07 DIAGNOSIS — E669 Obesity, unspecified: Secondary | ICD-10-CM | POA: Diagnosis not present

## 2021-08-07 DIAGNOSIS — E8881 Metabolic syndrome: Secondary | ICD-10-CM

## 2021-08-08 ENCOUNTER — Other Ambulatory Visit: Payer: Self-pay

## 2021-08-08 DIAGNOSIS — Z1329 Encounter for screening for other suspected endocrine disorder: Secondary | ICD-10-CM

## 2021-08-08 NOTE — Progress Notes (Signed)
TSH order placed per Dr.Leggett

## 2021-08-09 LAB — TSH: TSH: 1.67 u[IU]/mL (ref 0.450–4.500)

## 2021-08-10 ENCOUNTER — Other Ambulatory Visit (HOSPITAL_BASED_OUTPATIENT_CLINIC_OR_DEPARTMENT_OTHER): Payer: Self-pay

## 2021-08-13 MED ORDER — SAXENDA 18 MG/3ML ~~LOC~~ SOPN
3.0000 mg | PEN_INJECTOR | Freq: Every day | SUBCUTANEOUS | 1 refills | Status: DC
  Filled 2021-08-13: qty 15, 30d supply, fill #0
  Filled 2021-10-04: qty 15, 30d supply, fill #1

## 2021-08-13 NOTE — Progress Notes (Signed)
Subjective:     Kimberly Orr is a 35 y.o. female here for a routine exam.  Current complaints: desires to lose weight.   Pt diagnosed with insulin resistance and started on metformin.  She has not lost weight with attention to diet and exercise. Pt has not tried noom.   Gynecologic History Patient's last menstrual period was 07/20/2021. Contraception: vasectomy Last Pap: 05/2020. Results were: normal HIGH RISK HPV ([place]): Negative ADEQUACY: Satisfactory for evaluation; transformation zone component PRESENT. DIAGNOSIS: - Negative for Intraepithelial Lesions or Malignancy (NILM) DIAGNOSIS: - Benign reactive/reparative changes COMMENT (MOLECULAR): Normal Reference Range HPV - Negative Last mammogram: 07/2021 No focal or suspicious mammographic findings are identified in either breast.   Targeted ultrasound is performed, showing stable appearance of a circumscribed hypoechoic mass at the 11 o'clock position 4 cm from the nipple. It measures 9 x 5 x 4 mm (previously 10 x 5 x 5 mm).   IMPRESSION: 1. Stable, probably benign right breast mass. Recommend a final follow-up in 1 year. 2. No mammographic evidence of malignancy on the left.   RECOMMENDATION: Bilateral diagnostic mammogram and right breast ultrasound in 1 year.   I have discussed the findings and recommendations with the patient. If applicable, a reminder letter will be sent to the patient regarding the next appointment.   BI-RADS CATEGORY  3: Probably benign..  Obstetric History OB History  Gravida Para Term Preterm AB Living  3 2 2   1 2   SAB IAB Ectopic Multiple Live Births  1            # Outcome Date GA Lbr Len/2nd Weight Sex Delivery Anes PTL Lv  3 SAB           2 Term           1 Term              The following portions of the patient's history were reviewed and updated as appropriate: allergies, current medications, past family history, past medical history, past social history, past surgical  history, and problem list.  Review of Systems Pertinent items noted in HPI and remainder of comprehensive ROS otherwise negative.    Objective:     Vitals:   08/07/21 1515  BP: 128/87  Pulse: 69  Weight: 200 lb (90.7 kg)  Height: 5\' 5"  (1.651 m)   Vitals:  WNL General appearance: alert, cooperative and no distress  HEENT: Normocephalic, without obvious abnormality, atraumatic Eyes: negative Throat: lips, mucosa, and tongue normal; teeth and gums normal  Respiratory: Clear to auscultation bilaterally  CV: Regular rate and rhythm  Breasts:  Normal appearance, no masses or tenderness, no nipple retraction or dimpling  GI: Soft, non-tender; bowel sounds normal; no masses,  no organomegaly  GU: External Genitalia:  Tanner V, no lesion Urethra:  No prolapse   Vagina: Pink, normal rugae, no blood or discharge  Cervix: No CMT, no lesion  Uterus:  Normal size and contour, non tender  Adnexa: Normal, no masses, non tender  Musculoskeletal: No edema, redness or tenderness in the calves or thighs  Skin: No lesions or rash  Lymphatic: Axillary adenopathy: none     Psychiatric: Normal mood and behavior        Assessment:    Healthy female exam.  Obese Class 1; insulin resistance diagnosed by PCP / weight management office   Plan:    Pap smear up to date Follow radiology recommendations for breast surveillance Chameka has failed metformin and  diet / exercise at home.  Will try Saxenda.  Stop metformin.  Pt to weigh in monthly.  She works at [employer] and can send weight via my chart.  Annali also agrees to try noom to help with eating habits and change relationship with food.  Exercise encouraged.  Check TSH (normal last year) PCP to manage rest of health maintenance.

## 2021-08-14 ENCOUNTER — Other Ambulatory Visit (HOSPITAL_BASED_OUTPATIENT_CLINIC_OR_DEPARTMENT_OTHER): Payer: Self-pay

## 2021-08-15 ENCOUNTER — Other Ambulatory Visit (HOSPITAL_BASED_OUTPATIENT_CLINIC_OR_DEPARTMENT_OTHER): Payer: Self-pay

## 2021-08-17 ENCOUNTER — Other Ambulatory Visit: Payer: Self-pay | Admitting: *Deleted

## 2021-08-17 ENCOUNTER — Other Ambulatory Visit (HOSPITAL_BASED_OUTPATIENT_CLINIC_OR_DEPARTMENT_OTHER): Payer: Self-pay

## 2021-08-17 MED ORDER — PEN NEEDLES 32G X 6 MM MISC
1.0000 | Freq: Every day | 1 refills | Status: DC
  Filled 2021-08-17: qty 100, 90d supply, fill #0
  Filled 2022-02-08: qty 100, 90d supply, fill #1

## 2021-10-04 ENCOUNTER — Other Ambulatory Visit (HOSPITAL_BASED_OUTPATIENT_CLINIC_OR_DEPARTMENT_OTHER): Payer: Self-pay

## 2021-10-18 ENCOUNTER — Other Ambulatory Visit (HOSPITAL_BASED_OUTPATIENT_CLINIC_OR_DEPARTMENT_OTHER): Payer: Self-pay

## 2021-11-24 ENCOUNTER — Other Ambulatory Visit: Payer: Self-pay | Admitting: Obstetrics & Gynecology

## 2021-11-24 ENCOUNTER — Other Ambulatory Visit (HOSPITAL_BASED_OUTPATIENT_CLINIC_OR_DEPARTMENT_OTHER): Payer: Self-pay

## 2021-11-27 ENCOUNTER — Other Ambulatory Visit (HOSPITAL_BASED_OUTPATIENT_CLINIC_OR_DEPARTMENT_OTHER): Payer: Self-pay

## 2021-12-04 ENCOUNTER — Other Ambulatory Visit (HOSPITAL_BASED_OUTPATIENT_CLINIC_OR_DEPARTMENT_OTHER): Payer: Self-pay

## 2021-12-04 ENCOUNTER — Other Ambulatory Visit: Payer: Self-pay | Admitting: Obstetrics & Gynecology

## 2021-12-04 ENCOUNTER — Encounter: Payer: Self-pay | Admitting: *Deleted

## 2021-12-06 ENCOUNTER — Telehealth: Payer: Self-pay | Admitting: *Deleted

## 2021-12-06 ENCOUNTER — Other Ambulatory Visit (HOSPITAL_BASED_OUTPATIENT_CLINIC_OR_DEPARTMENT_OTHER): Payer: Self-pay

## 2021-12-06 MED ORDER — FLUCONAZOLE 150 MG PO TABS
150.0000 mg | ORAL_TABLET | Freq: Once | ORAL | 0 refills | Status: AC
  Filled 2021-12-06: qty 2, 3d supply, fill #0

## 2021-12-06 NOTE — Telephone Encounter (Cosign Needed)
Pt called stating that she has vaginal itching with d/c for 2 days.  The d/c consistency is white and thick.  Spoke with Dr Gala Romney who ordered Diflucan to be sent to pharmacy.  If not improved after treatment will need appt.  Pt voices understanding.

## 2021-12-07 ENCOUNTER — Other Ambulatory Visit (HOSPITAL_BASED_OUTPATIENT_CLINIC_OR_DEPARTMENT_OTHER): Payer: Self-pay

## 2021-12-08 ENCOUNTER — Other Ambulatory Visit: Payer: Self-pay | Admitting: *Deleted

## 2021-12-08 ENCOUNTER — Other Ambulatory Visit (HOSPITAL_BASED_OUTPATIENT_CLINIC_OR_DEPARTMENT_OTHER): Payer: Self-pay

## 2021-12-08 MED ORDER — SAXENDA 18 MG/3ML ~~LOC~~ SOPN
3.0000 mg | PEN_INJECTOR | Freq: Every day | SUBCUTANEOUS | 1 refills | Status: DC
  Filled 2021-12-08: qty 18, 36d supply, fill #0
  Filled 2022-03-23: qty 18, 36d supply, fill #1

## 2022-01-16 DIAGNOSIS — L905 Scar conditions and fibrosis of skin: Secondary | ICD-10-CM | POA: Diagnosis not present

## 2022-01-16 DIAGNOSIS — D485 Neoplasm of uncertain behavior of skin: Secondary | ICD-10-CM | POA: Diagnosis not present

## 2022-01-16 DIAGNOSIS — D225 Melanocytic nevi of trunk: Secondary | ICD-10-CM | POA: Diagnosis not present

## 2022-01-16 DIAGNOSIS — D2261 Melanocytic nevi of right upper limb, including shoulder: Secondary | ICD-10-CM | POA: Diagnosis not present

## 2022-01-16 DIAGNOSIS — L814 Other melanin hyperpigmentation: Secondary | ICD-10-CM | POA: Diagnosis not present

## 2022-01-16 DIAGNOSIS — D2271 Melanocytic nevi of right lower limb, including hip: Secondary | ICD-10-CM | POA: Diagnosis not present

## 2022-01-16 DIAGNOSIS — Z85828 Personal history of other malignant neoplasm of skin: Secondary | ICD-10-CM | POA: Diagnosis not present

## 2022-01-16 DIAGNOSIS — L578 Other skin changes due to chronic exposure to nonionizing radiation: Secondary | ICD-10-CM | POA: Diagnosis not present

## 2022-01-16 DIAGNOSIS — D2272 Melanocytic nevi of left lower limb, including hip: Secondary | ICD-10-CM | POA: Diagnosis not present

## 2022-01-16 DIAGNOSIS — D2262 Melanocytic nevi of left upper limb, including shoulder: Secondary | ICD-10-CM | POA: Diagnosis not present

## 2022-01-16 DIAGNOSIS — C44319 Basal cell carcinoma of skin of other parts of face: Secondary | ICD-10-CM | POA: Diagnosis not present

## 2022-02-08 ENCOUNTER — Other Ambulatory Visit (HOSPITAL_BASED_OUTPATIENT_CLINIC_OR_DEPARTMENT_OTHER): Payer: Self-pay

## 2022-02-19 IMAGING — MG DIGITAL DIAGNOSTIC BILAT W/ TOMO W/ CAD
8 of 15 series · 8 of 40 positions shown · non-contrast
Comparison: None.

CLINICAL DATA: 34-year-old female presenting for evaluation of
constant lateral left breast pain for about 3 weeks.

EXAM:
DIGITAL DIAGNOSTIC BILATERAL MAMMOGRAM WITH TOMO AND CAD; ULTRASOUND
RIGHT BREAST LIMITED; ULTRASOUND LEFT BREAST LIMITED

[L CC synth-2D (1 of 2)]
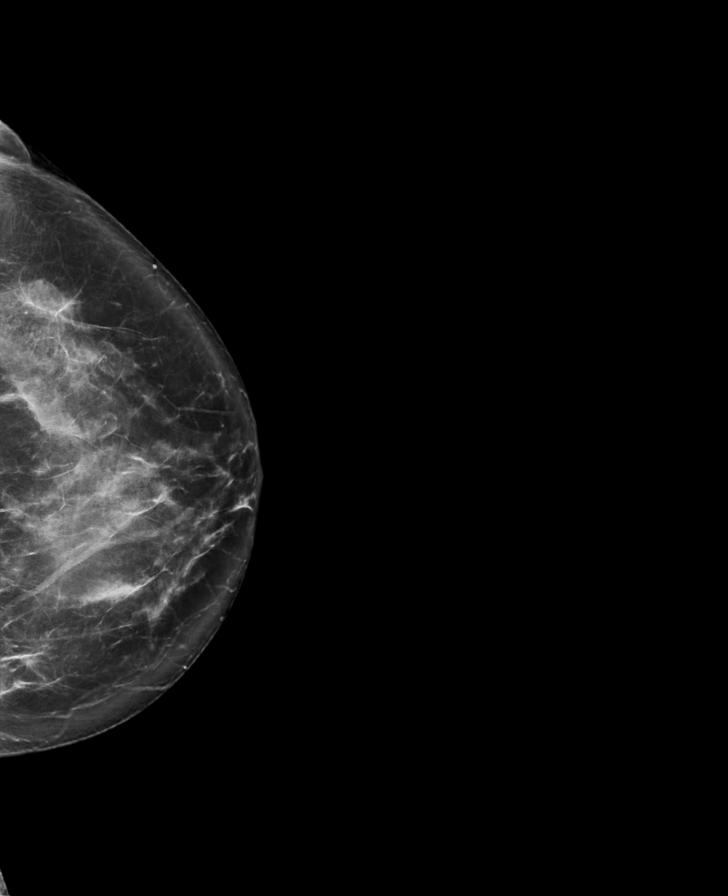

[R MLO synth-2D (1 of 2)]
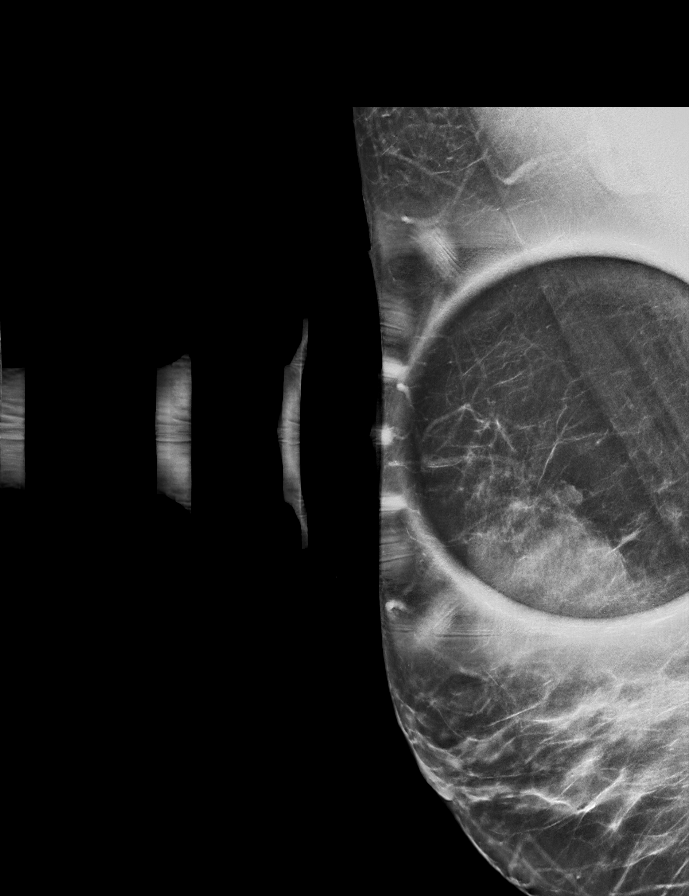

[L MLO synth-2D]
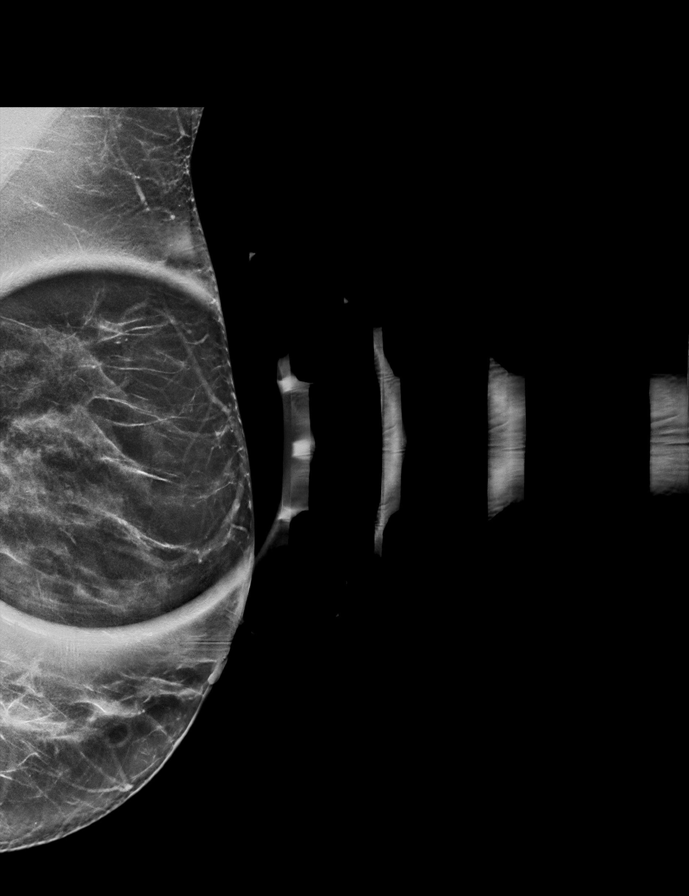

[R CC synth-2D (1 of 2)]
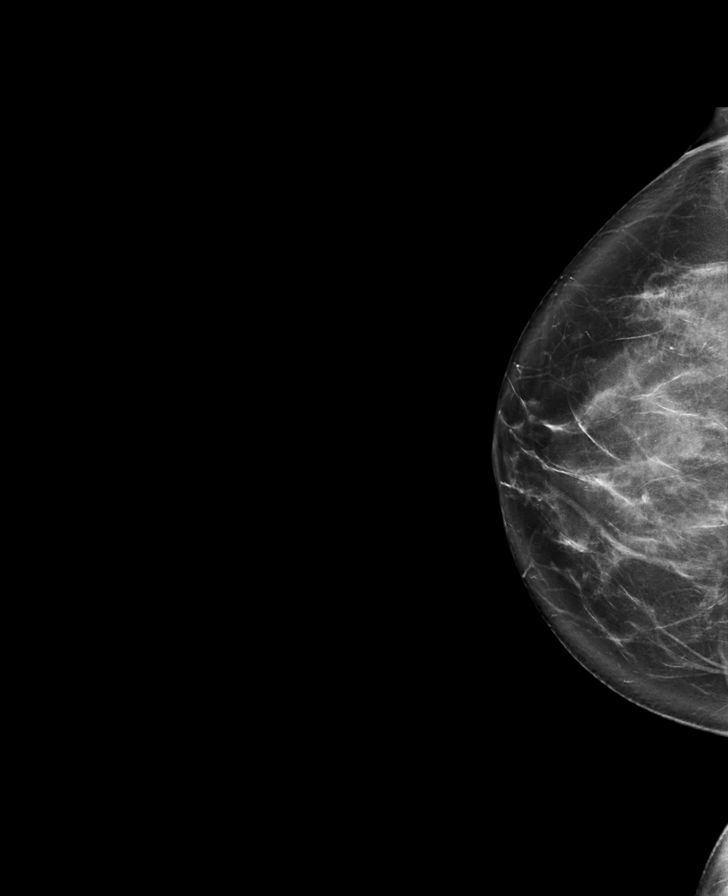

[R CC synth-2D (2 of 2)]
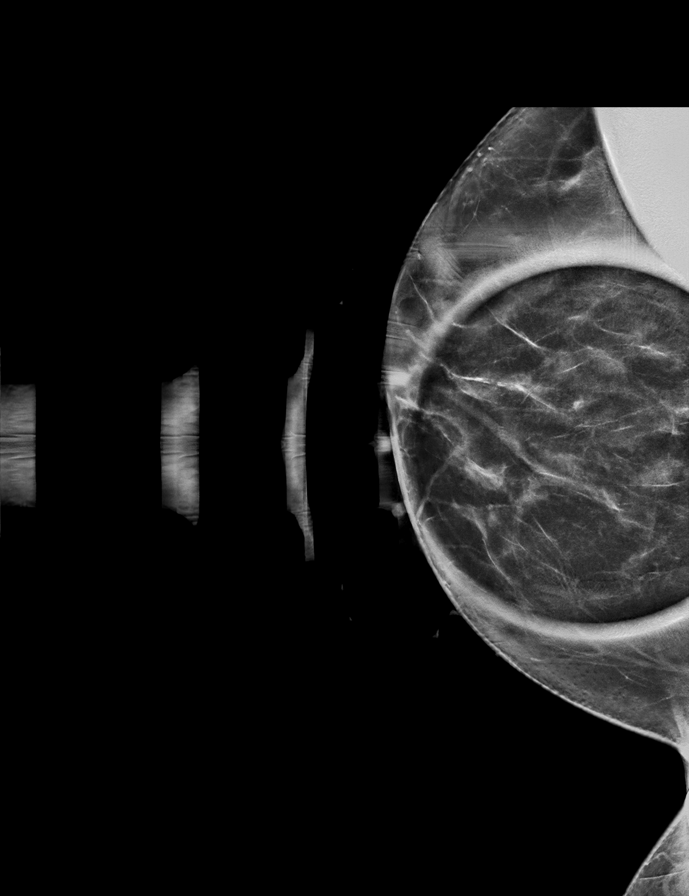

[L CC synth-2D (2 of 2)]
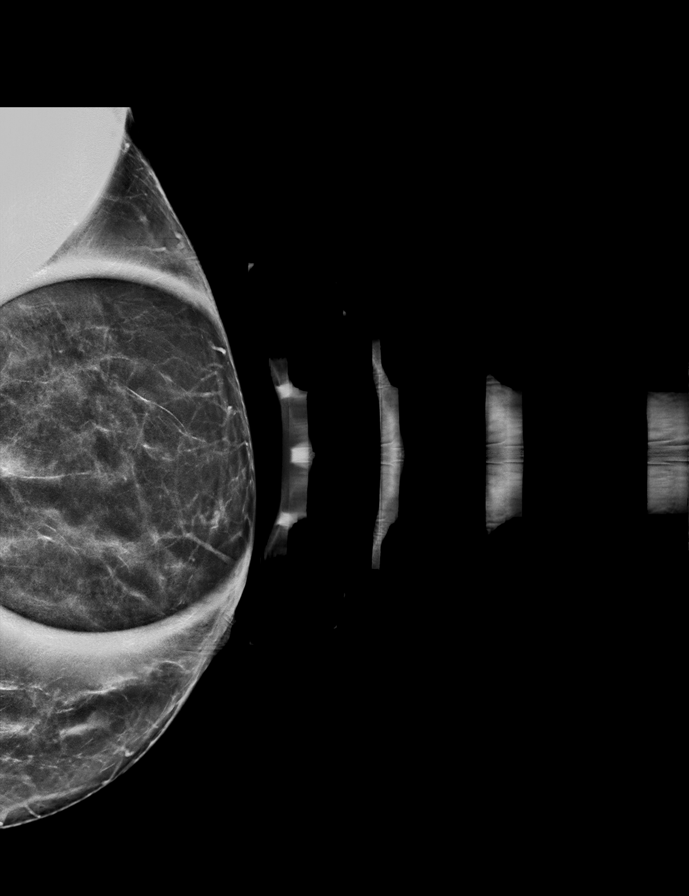

[R MLO synth-2D (2 of 2)]
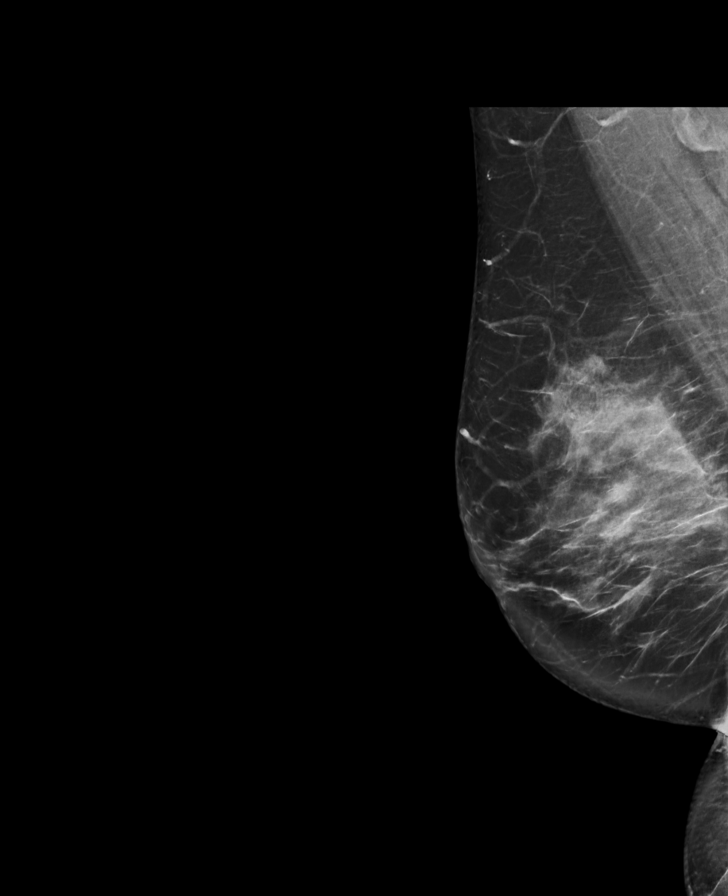

[R CC tomo · tomo slice 48/70.0]
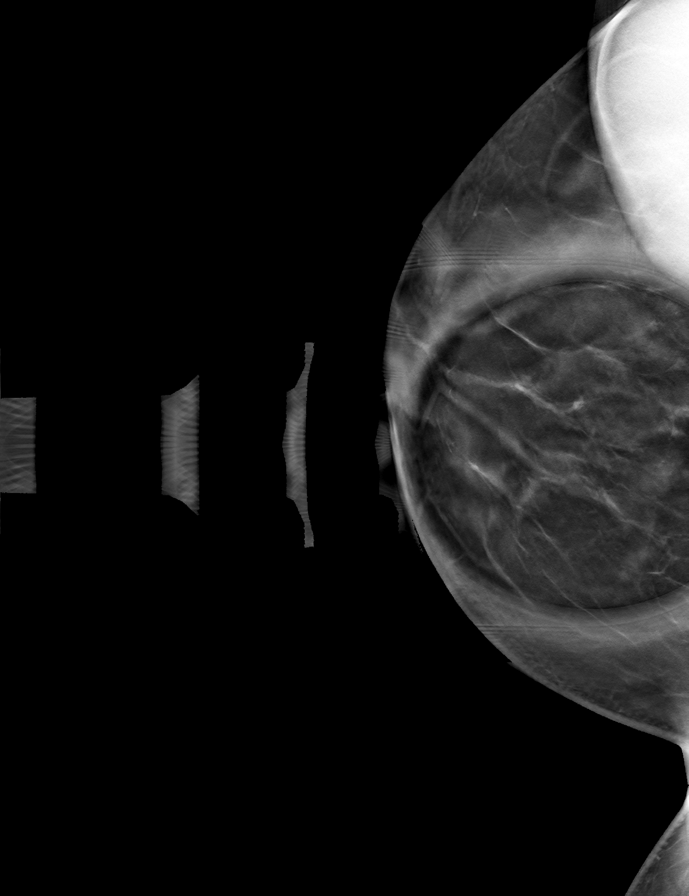

[8 of 40 positions shown; findings below may reference images not displayed]

ACR Breast Density Category c: The breast tissue is heterogeneously
dense, which may obscure small masses.
FINDINGS: There is a subcentimeter obscured mass in the upper outer quadrant
of the left breast, middle depth. Another obscured subcentimeter
mass is seen in the superior posterior central right breast. No
other suspicious calcifications, masses or areas of distortion are
seen in the bilateral breasts.

Mammographic images were processed with CAD.

Ultrasound of the superior right breast demonstrates numerous
anechoic oval circumscribed cysts all measuring less than 1 cm.
There a mass at 11 o'clock, 4 cm from the nipple in the right breast
measuring 1.0 x 0.5 x 0.4 cm, favored to represent a cluster of
cysts which is not completely anechoic.

Ultrasound of the lateral aspect of the left breast demonstrates
numerous anechoic subcentimeter cysts. The cyst likely corresponding
with the mass identified mammographically is at 2 o'clock, 3 cm from
the nipple measuring 6 mm. No suspicious solid masses or areas of
shadowing are identified.
IMPRESSION: 1. There is a likely benign 1.0 mass in the right breast at 11
o'clock, favored to represent a cluster of cysts.

2. Diffuse bilateral fibrocystic changes. This may be a source of
pain, and could account for the pain the patient is having in the
upper-outer left breast.

3.  No mammographic evidence of malignancy in the bilateral breasts.

RECOMMENDATION:
1.  Six-month follow-up right breast ultrasound.

I have discussed the findings and recommendations with the patient.
If applicable, a reminder letter will be sent to the patient
regarding the next appointment.

BI-RADS CATEGORY  3: Probably benign.

## 2022-03-23 ENCOUNTER — Other Ambulatory Visit (HOSPITAL_BASED_OUTPATIENT_CLINIC_OR_DEPARTMENT_OTHER): Payer: Self-pay

## 2022-04-17 ENCOUNTER — Encounter: Payer: 59 | Admitting: Family Medicine

## 2022-04-23 ENCOUNTER — Ambulatory Visit (INDEPENDENT_AMBULATORY_CARE_PROVIDER_SITE_OTHER): Payer: 59 | Admitting: Family Medicine

## 2022-04-23 ENCOUNTER — Telehealth: Payer: Self-pay | Admitting: Family Medicine

## 2022-04-23 ENCOUNTER — Encounter: Payer: Self-pay | Admitting: Family Medicine

## 2022-04-23 ENCOUNTER — Other Ambulatory Visit (HOSPITAL_BASED_OUTPATIENT_CLINIC_OR_DEPARTMENT_OTHER): Payer: Self-pay

## 2022-04-23 VITALS — BP 118/68 | HR 62 | Temp 97.5°F | Ht 65.0 in | Wt 201.4 lb

## 2022-04-23 DIAGNOSIS — Z23 Encounter for immunization: Secondary | ICD-10-CM | POA: Diagnosis not present

## 2022-04-23 DIAGNOSIS — E669 Obesity, unspecified: Secondary | ICD-10-CM

## 2022-04-23 DIAGNOSIS — Z0000 Encounter for general adult medical examination without abnormal findings: Secondary | ICD-10-CM | POA: Diagnosis not present

## 2022-04-23 MED ORDER — SEMAGLUTIDE-WEIGHT MANAGEMENT 2.4 MG/0.75ML ~~LOC~~ SOAJ
2.4000 mg | SUBCUTANEOUS | 0 refills | Status: DC
  Filled 2022-04-23 – 2022-09-04 (×3): qty 3, 28d supply, fill #0

## 2022-04-23 MED ORDER — SEMAGLUTIDE-WEIGHT MANAGEMENT 1 MG/0.5ML ~~LOC~~ SOAJ
1.0000 mg | SUBCUTANEOUS | 0 refills | Status: AC
  Filled 2022-04-23 – 2022-07-02 (×2): qty 2, 28d supply, fill #0

## 2022-04-23 MED ORDER — SEMAGLUTIDE-WEIGHT MANAGEMENT 0.5 MG/0.5ML ~~LOC~~ SOAJ
0.5000 mg | SUBCUTANEOUS | 0 refills | Status: AC
  Filled 2022-04-23 – 2022-05-21 (×2): qty 2, 28d supply, fill #0

## 2022-04-23 MED ORDER — SEMAGLUTIDE-WEIGHT MANAGEMENT 0.25 MG/0.5ML ~~LOC~~ SOAJ
0.2500 mg | SUBCUTANEOUS | 0 refills | Status: AC
  Filled 2022-04-23: qty 2, 28d supply, fill #0

## 2022-04-23 MED ORDER — SEMAGLUTIDE-WEIGHT MANAGEMENT 1.7 MG/0.75ML ~~LOC~~ SOAJ
1.7000 mg | SUBCUTANEOUS | 0 refills | Status: AC
  Filled 2022-04-23 – 2022-08-07 (×2): qty 3, 28d supply, fill #0

## 2022-04-23 NOTE — Progress Notes (Signed)
Chief Complaint  Patient presents with   Annual Exam     Well Woman Kimberly Orr is here for a complete physical.   Her last physical was >1 year ago.  Current diet: in general, a "healthy" diet. Current exercise: walking, some lifting. Fatigue out of ordinary? No Seatbelt? Yes Advanced directive? No  Health Maintenance Pap/HPV- Yes Tetanus- Due HIV screening- Yes Hep C screening- Yes  Obesity Struggling after gaining weight and inability to lose it. She was rx'd Saxenda by her GYN but has not lost any weight despite being on it for several months. She reports compliance without AE's. It has been over a week since she took it due to being on vacation. Diet/exercise as above. Interested in other options. She did well on Adipex in the past but did gain weight back after she stopped it.   Past Medical History:  Diagnosis Date   Anemia    Basal cell carcinoma    Depression    GERD (gastroesophageal reflux disease)    History of chicken pox    Joint pain    Weight gain      Past Surgical History:  Procedure Laterality Date   DILATION AND CURETTAGE OF UTERUS  10/22/2009   LUMBAR DISC SURGERY     WISDOM TOOTH EXTRACTION      Medications  Current Outpatient Medications on File Prior to Visit  Medication Sig Dispense Refill   cefadroxil (DURICEF) 500 MG capsule Take 1 capsule (500 mg total) by mouth 2 (two) times daily. 14 capsule 0   ferrous sulfate 325 (65 FE) MG tablet Take 1 tablet (325 mg total) by mouth daily with breakfast. 30 tablet 0   Insulin Pen Needle (PEN NEEDLES) 32G X 6 MM MISC Use as needed daily for injections 100 each 1   Liraglutide -Weight Management (SAXENDA) 18 MG/3ML SOPN Inject 0.'6mg'$  under the skin once daily for 1 week, then increase by 0.'6mg'$  daily until target of '3mg'$  daily. 18 mL 1   Multiple Vitamins-Minerals (WOMENS MULTIVITAMIN PO)      [DISCONTINUED] metFORMIN (GLUCOPHAGE) 500 MG tablet Take 1 tablet (500 mg total) by mouth in the morning and  at bedtime. 60 tablet 0   Allergies No Known Allergies  Review of Systems: Constitutional:  no unexpected weight changes Eye:  no recent significant change in vision Ear/Nose/Mouth/Throat:  Ears:  no tinnitus or vertigo and no recent change in hearing Nose/Mouth/Throat:  no complaints of nasal congestion, no sore throat Cardiovascular: no chest pain Respiratory:  no cough and no shortness of breath Gastrointestinal:  no abdominal pain, no change in bowel habits GU:  Female: negative for dysuria or pelvic pain Musculoskeletal/Extremities:  no pain of the joints Integumentary (Skin/Breast):  no abnormal skin lesions reported Neurologic:  no headaches Endocrine:  denies fatigue Hematologic/Lymphatic:  No areas of easy bleeding  Exam BP 118/68   Pulse 62   Temp (!) 97.5 F (36.4 C) (Oral)   Ht '5\' 5"'$  (1.651 m)   Wt 201 lb 6 oz (91.3 kg)   SpO2 97%   BMI 33.51 kg/m  General:  well developed, well nourished, in no apparent distress Skin:  no significant moles, warts, or growths Head:  no masses, lesions, or tenderness Eyes:  pupils equal and round, sclera anicteric without injection Ears:  canals without lesions, TMs shiny without retraction, no obvious effusion, no erythema Nose:  nares patent, septum midline, mucosa normal, and no drainage or sinus tenderness Throat/Pharynx:  lips and gingiva without  lesion; tongue and uvula midline; non-inflamed pharynx; no exudates or postnasal drainage Neck: neck supple without adenopathy, thyromegaly, or masses Lungs:  clear to auscultation, breath sounds equal bilaterally, no respiratory distress Cardio:  regular rate and rhythm, no bruits, no LE edema Abdomen:  abdomen soft, nontender; bowel sounds normal; no masses or organomegaly Genital: Defer to GYN Musculoskeletal:  symmetrical muscle groups noted without atrophy or deformity Extremities:  no clubbing, cyanosis, or edema, no deformities, no skin discoloration Neuro:  gait normal;  deep tendon reflexes normal and symmetric Psych: well oriented with normal range of affect and appropriate judgment/insight  Assessment and Plan  Well adult exam - Plan: CBC, Comprehensive metabolic panel, Lipid panel  Need for Tdap vaccination - Plan: Tdap vaccine greater than or equal to 7yo IM  Obesity (BMI 30-39.9) - Plan: Semaglutide-Weight Management 0.25 MG/0.5ML SOAJ, Semaglutide-Weight Management 0.5 MG/0.5ML SOAJ, Semaglutide-Weight Management 1 MG/0.5ML SOAJ, Semaglutide-Weight Management 1.7 MG/0.75ML SOAJ, Semaglutide-Weight Management 2.4 MG/0.75ML SOAJ  Vitamin D deficiency   Well 36 y.o. female. Counseled on diet and exercise. Other orders as above. Obesity: Chronic, not controlled. Stop Saxenda, start Wegovy. F/u in 6 weeks to reck.  Advanced directive form provided today.  The patient voiced understanding and agreement to the plan.  [place], DO 04/23/22 4:06 PM

## 2022-04-23 NOTE — Telephone Encounter (Signed)
Initiated PA for [facility] KEY:  [id number] Waiting response from Cover my meds.

## 2022-04-23 NOTE — Patient Instructions (Addendum)
Give [place] 2-3 business days to get the results of your labs back.   Keep the diet clean and stay active.  Please get me a copy of your advanced directive form at your convenience.   Artificial tears like Refresh and Systane may be used for comfort. OK to get generic version. Generally people use them every 2-4 hours, but you can use them as much as you want because there is no medication in it.  Let us know if you need anything.

## 2022-04-25 ENCOUNTER — Other Ambulatory Visit: Payer: 59

## 2022-04-25 ENCOUNTER — Other Ambulatory Visit (HOSPITAL_BASED_OUTPATIENT_CLINIC_OR_DEPARTMENT_OTHER): Payer: Self-pay

## 2022-04-26 ENCOUNTER — Other Ambulatory Visit (HOSPITAL_BASED_OUTPATIENT_CLINIC_OR_DEPARTMENT_OTHER): Payer: Self-pay

## 2022-04-26 ENCOUNTER — Other Ambulatory Visit (INDEPENDENT_AMBULATORY_CARE_PROVIDER_SITE_OTHER): Payer: 59

## 2022-04-26 DIAGNOSIS — Z0000 Encounter for general adult medical examination without abnormal findings: Secondary | ICD-10-CM

## 2022-04-26 LAB — CBC
HCT: 42.3 % (ref 36.0–46.0)
Hemoglobin: 13.8 g/dL (ref 12.0–15.0)
MCHC: 32.6 g/dL (ref 30.0–36.0)
MCV: 82.5 fl (ref 78.0–100.0)
Platelets: 310 10*3/uL (ref 150.0–400.0)
RBC: 5.13 Mil/uL — ABNORMAL HIGH (ref 3.87–5.11)
RDW: 13.8 % (ref 11.5–15.5)
WBC: 8.9 10*3/uL (ref 4.0–10.5)

## 2022-04-26 LAB — LIPID PANEL
Cholesterol: 153 mg/dL (ref 0–200)
HDL: 51 mg/dL (ref >39.00)
LDL Cholesterol: 89 mg/dL (ref 0–99)
NonHDL: 102
Total CHOL/HDL Ratio: 3
Triglycerides: 64 mg/dL (ref 0.0–149.0)
VLDL: 12.8 mg/dL (ref 0.0–40.0)

## 2022-04-26 LAB — COMPREHENSIVE METABOLIC PANEL
ALT: 15 U/L (ref 0–35)
AST: 13 U/L (ref 0–37)
Albumin: 4.5 g/dL (ref 3.5–5.2)
Alkaline Phosphatase: 72 U/L (ref 39–117)
BUN: 15 mg/dL (ref 6–23)
CO2: 24 mEq/L (ref 19–32)
Calcium: 9.1 mg/dL (ref 8.4–10.5)
Chloride: 105 mEq/L (ref 96–112)
Creatinine, Ser: 0.86 mg/dL (ref 0.40–1.20)
GFR: 85.75 mL/min (ref >60.00)
Glucose, Bld: 99 mg/dL (ref 70–99)
Potassium: 4.4 mEq/L (ref 3.5–5.1)
Sodium: 138 mEq/L (ref 135–145)
Total Bilirubin: 0.5 mg/dL (ref 0.2–1.2)
Total Protein: 6.5 g/dL (ref 6.0–8.3)

## 2022-04-27 NOTE — Telephone Encounter (Signed)
Received approval from 04/26/2022 to 11/15/2022 Patient/pharmacy aware.

## 2022-05-18 DIAGNOSIS — C44319 Basal cell carcinoma of skin of other parts of face: Secondary | ICD-10-CM | POA: Diagnosis not present

## 2022-05-21 ENCOUNTER — Encounter: Payer: Self-pay | Admitting: Family Medicine

## 2022-05-21 ENCOUNTER — Other Ambulatory Visit (HOSPITAL_BASED_OUTPATIENT_CLINIC_OR_DEPARTMENT_OTHER): Payer: Self-pay

## 2022-05-30 ENCOUNTER — Encounter (INDEPENDENT_AMBULATORY_CARE_PROVIDER_SITE_OTHER): Payer: Self-pay

## 2022-05-31 ENCOUNTER — Other Ambulatory Visit (HOSPITAL_BASED_OUTPATIENT_CLINIC_OR_DEPARTMENT_OTHER): Payer: Self-pay

## 2022-06-04 ENCOUNTER — Ambulatory Visit: Payer: 59 | Admitting: Family Medicine

## 2022-06-05 ENCOUNTER — Encounter (HOSPITAL_BASED_OUTPATIENT_CLINIC_OR_DEPARTMENT_OTHER): Payer: Self-pay

## 2022-06-05 ENCOUNTER — Other Ambulatory Visit (HOSPITAL_BASED_OUTPATIENT_CLINIC_OR_DEPARTMENT_OTHER): Payer: Self-pay

## 2022-06-11 ENCOUNTER — Ambulatory Visit: Payer: 59 | Admitting: Family Medicine

## 2022-06-11 ENCOUNTER — Encounter: Payer: Self-pay | Admitting: Family Medicine

## 2022-06-11 ENCOUNTER — Other Ambulatory Visit (HOSPITAL_BASED_OUTPATIENT_CLINIC_OR_DEPARTMENT_OTHER): Payer: Self-pay

## 2022-06-11 VITALS — BP 108/72 | HR 85 | Temp 97.7°F | Ht 65.0 in | Wt 197.0 lb

## 2022-06-11 DIAGNOSIS — E669 Obesity, unspecified: Secondary | ICD-10-CM | POA: Diagnosis not present

## 2022-06-11 DIAGNOSIS — J069 Acute upper respiratory infection, unspecified: Secondary | ICD-10-CM

## 2022-06-11 DIAGNOSIS — Z6831 Body mass index (BMI) 31.0-31.9, adult: Secondary | ICD-10-CM

## 2022-06-11 MED ORDER — HYDROCODONE BIT-HOMATROP MBR 5-1.5 MG/5ML PO SOLN
5.0000 mL | Freq: Four times a day (QID) | ORAL | 0 refills | Status: DC | PRN
  Filled 2022-06-11: qty 120, 6d supply, fill #0

## 2022-06-11 NOTE — Progress Notes (Signed)
Chief Complaint  Patient presents with   Follow-up    Wegovy Chest congestion     Subjective: Patient is a 36 y.o. female here for follow up.  Taking Wegovy 0.5 mg/week currently. Tolerating well, reports compliance.  Affordable. Diet is healthy. Exercising with cardio and wt resistance.   URI Duration: 1.5 weeks  Associated symptoms: ear fullness, wheezing, and coughing Denies: sinus congestion, sinus pain, rhinorrhea, itchy watery eyes, ear pain, ear drainage, sore throat, myalgia, and fevers Treatment to date: Robitussin, allergy medicine Sick contacts: Yes Tested neg for covid.   Past Medical History:  Diagnosis Date   Anemia    Basal cell carcinoma    Depression    GERD (gastroesophageal reflux disease)    History of chicken pox    Joint pain    Weight gain     Objective: BP 108/72   Pulse 85   Temp 97.7 F (36.5 C) (Oral)   Ht '5\' 5"'$  (1.651 m)   Wt 197 lb (89.4 kg)   SpO2 97%   BMI 32.78 kg/m  General: Awake, appears stated age HEENT: Ears are patent without otorrhea, TMs negative bilaterally, nares are patent without rhinorrhea, no sinus TTP, MMM, no pharyngeal exudate or erythema Heart: RRR, no LE edema Lungs: CTAB, no rales, wheezes or rhonchi. No accessory muscle use Psych: Age appropriate judgment and insight, normal affect and mood  Assessment and Plan: Class 1 obesity with serious comorbidity and body mass index (BMI) of 31.0 to 31.9 in adult, unspecified obesity type  Viral URI with cough - Plan: HYDROcodone bit-homatropine (HYCODAN) 5-1.5 MG/5ML syrup  Chronic, stable.  Continue escalating dose of Wegovy hoping to get to 2.4 mg weekly.  Counseled on diet and exercise.  She is losing weight. New problem, self-limited.  She is slightly improving, will see if she can continue to improve with only symptomatic care.  Push fluids, wash hands, cover mouth when coughing.  Warnings about narcotic Serpe verbalized and written down.  She is a Marine scientist. The  patient voiced understanding and agreement to the plan.  [place], DO 06/11/22  2:59 PM

## 2022-06-11 NOTE — Patient Instructions (Signed)
Continue to push fluids, practice good hand hygiene, and cover your mouth if you cough.  If you start having fevers, shaking or shortness of breath, seek immediate care.  Do not drink alcohol, do any illicit/street drugs, drive or do anything that requires alertness while on this medicine.   Send me a message if no improvement with your URI.   Let us know if you need anything.

## 2022-06-22 ENCOUNTER — Other Ambulatory Visit (HOSPITAL_BASED_OUTPATIENT_CLINIC_OR_DEPARTMENT_OTHER): Payer: Self-pay

## 2022-07-02 ENCOUNTER — Other Ambulatory Visit (HOSPITAL_BASED_OUTPATIENT_CLINIC_OR_DEPARTMENT_OTHER): Payer: Self-pay

## 2022-07-24 ENCOUNTER — Other Ambulatory Visit (HOSPITAL_BASED_OUTPATIENT_CLINIC_OR_DEPARTMENT_OTHER): Payer: Self-pay

## 2022-07-24 DIAGNOSIS — L578 Other skin changes due to chronic exposure to nonionizing radiation: Secondary | ICD-10-CM | POA: Diagnosis not present

## 2022-07-24 DIAGNOSIS — E669 Obesity, unspecified: Secondary | ICD-10-CM | POA: Diagnosis not present

## 2022-07-24 DIAGNOSIS — L568 Other specified acute skin changes due to ultraviolet radiation: Secondary | ICD-10-CM | POA: Diagnosis not present

## 2022-07-24 DIAGNOSIS — D225 Melanocytic nevi of trunk: Secondary | ICD-10-CM | POA: Diagnosis not present

## 2022-07-24 DIAGNOSIS — L814 Other melanin hyperpigmentation: Secondary | ICD-10-CM | POA: Diagnosis not present

## 2022-07-24 DIAGNOSIS — Z85828 Personal history of other malignant neoplasm of skin: Secondary | ICD-10-CM | POA: Diagnosis not present

## 2022-07-24 DIAGNOSIS — X32XXXS Exposure to sunlight, sequela: Secondary | ICD-10-CM | POA: Diagnosis not present

## 2022-07-24 MED ORDER — ALCLOMETASONE DIPROPIONATE 0.05 % EX OINT
1.0000 | TOPICAL_OINTMENT | Freq: Two times a day (BID) | CUTANEOUS | 1 refills | Status: AC
  Filled 2022-07-24: qty 30, 15d supply, fill #0

## 2022-07-25 ENCOUNTER — Other Ambulatory Visit (HOSPITAL_BASED_OUTPATIENT_CLINIC_OR_DEPARTMENT_OTHER): Payer: Self-pay

## 2022-08-07 ENCOUNTER — Other Ambulatory Visit (HOSPITAL_BASED_OUTPATIENT_CLINIC_OR_DEPARTMENT_OTHER): Payer: Self-pay

## 2022-08-27 IMAGING — US US BREAST*R* LIMITED INC AXILLA
1 series · 6 of 6 positions shown · non-contrast
Comparison: Previous exam(s).

CLINICAL DATA: Short-term follow-up for a likely benign right
breast mass.

EXAM:
ULTRASOUND OF THE RIGHT BREAST

[Series 1: us breast*right* limited inc axilla · 0.06mm/px · 6 of 6 slices shown]
[im 1/6]
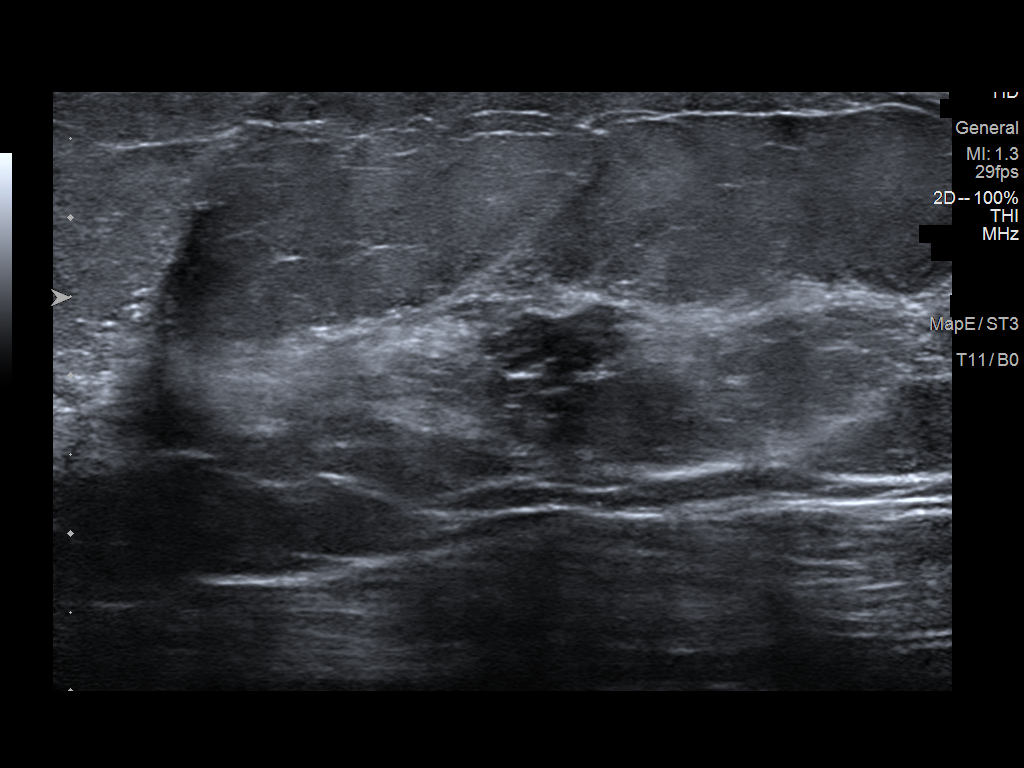
[im 2/6]
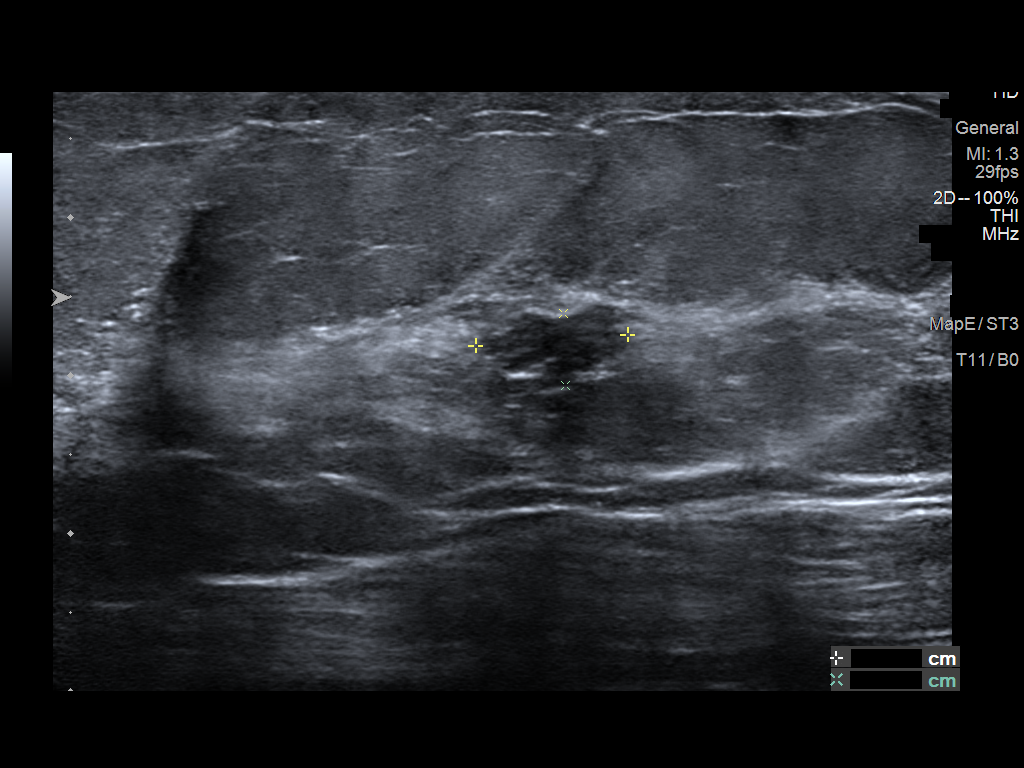
[im 3/6]
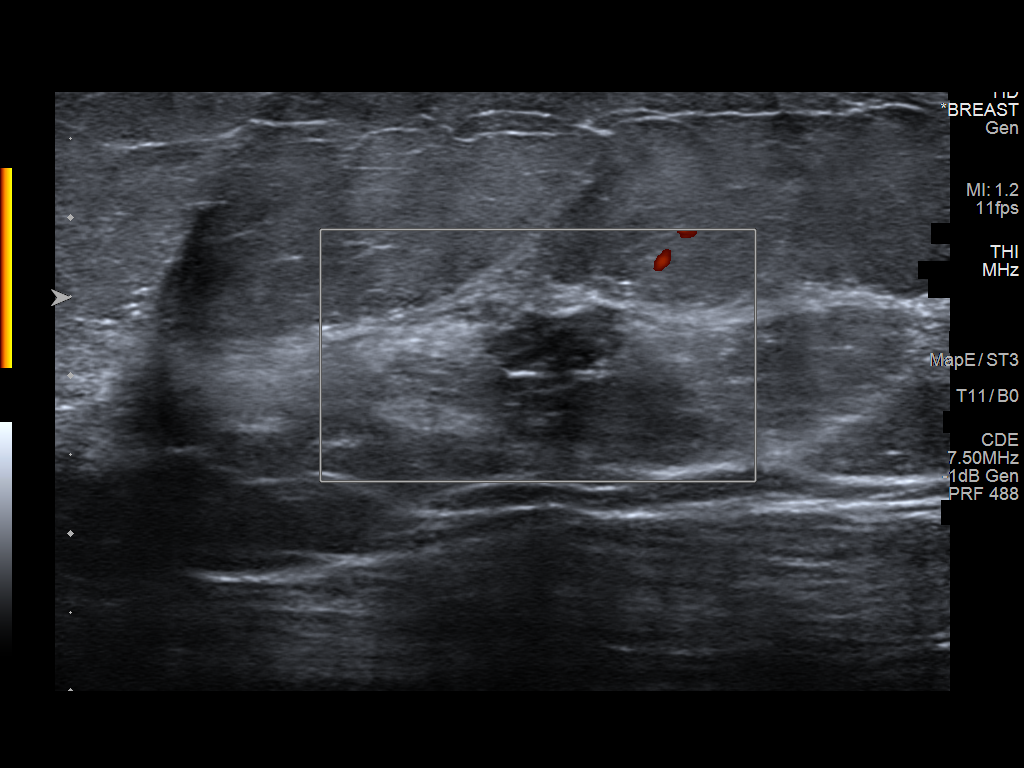
[im 4/6]
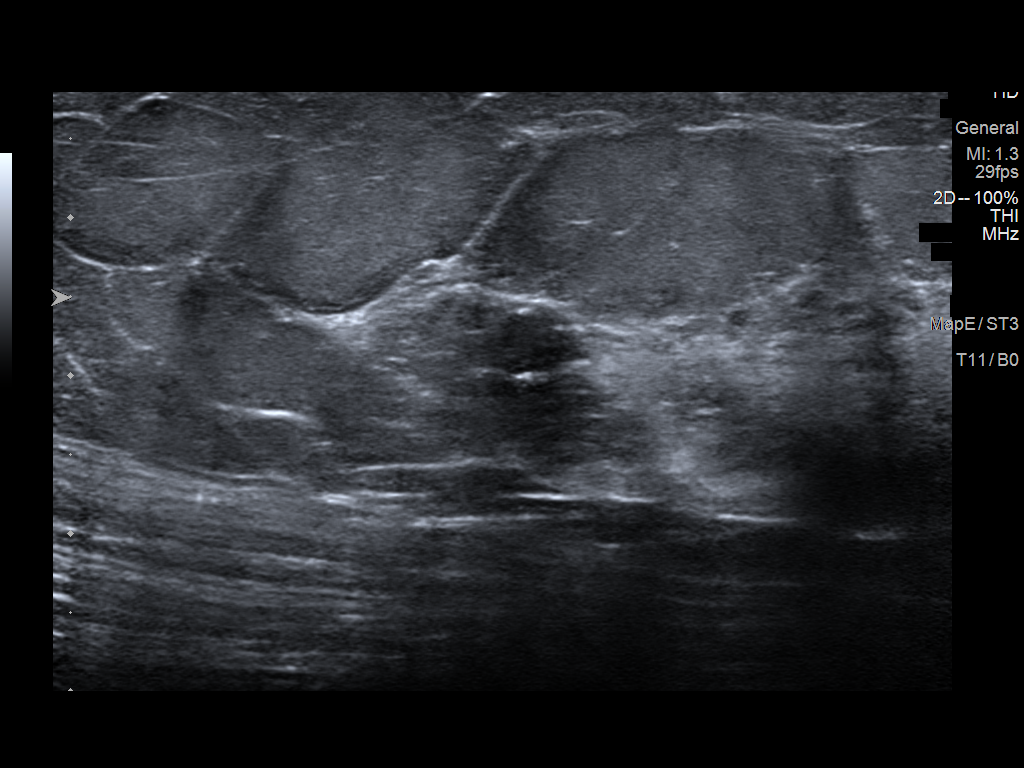
[im 5/6]
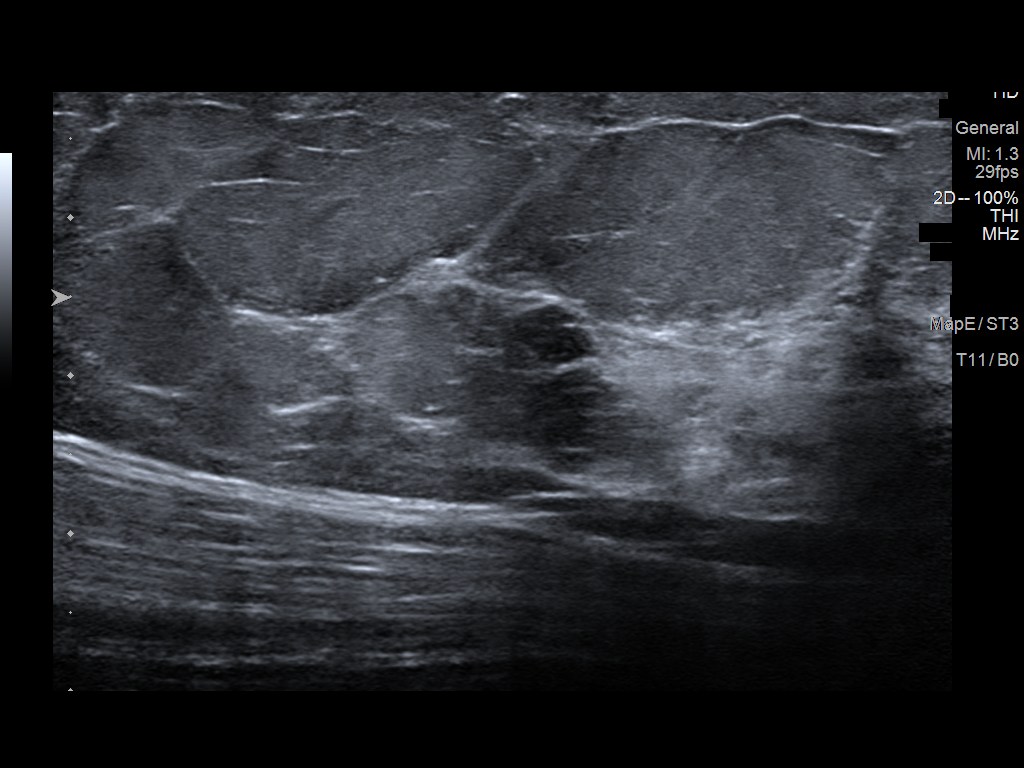
[im 6/6]
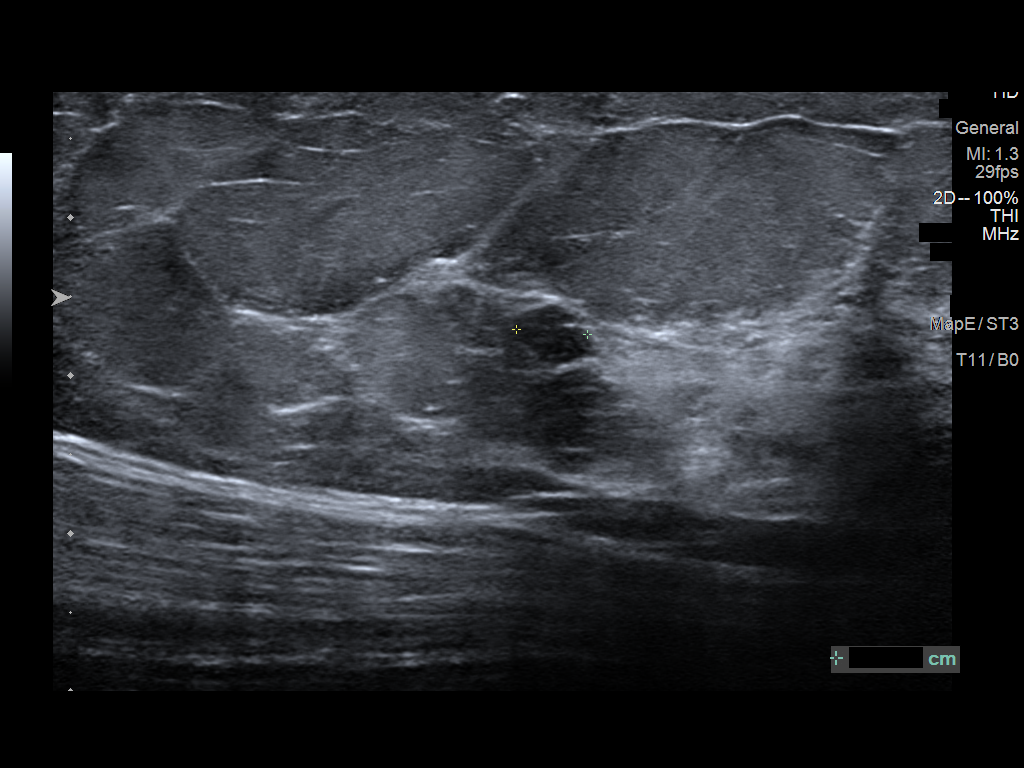

[6 of 6 positions shown; findings below may reference images not displayed]

FINDINGS: Ultrasound targeted to the right breast at 11 o'clock, 4 cm from the
nipple demonstrates a stable oval hypoechoic mass measuring 1.0 x
0.5 x 0.5 cm, previously 1.0 x 0.5 x 0.4 cm.
IMPRESSION: The likely benign mass in the right breast at 11 o'clock is stable.

RECOMMENDATION:
Bilateral diagnostic mammogram and right breast ultrasound in
Tuesday June, 2021.

I have discussed the findings and recommendations with the patient.
If applicable, a reminder letter will be sent to the patient
regarding the next appointment.

BI-RADS CATEGORY  3: Probably benign.

## 2022-09-04 ENCOUNTER — Other Ambulatory Visit (HOSPITAL_BASED_OUTPATIENT_CLINIC_OR_DEPARTMENT_OTHER): Payer: Self-pay

## 2022-10-03 ENCOUNTER — Encounter: Payer: Self-pay | Admitting: Family Medicine

## 2022-10-03 ENCOUNTER — Ambulatory Visit: Payer: 59 | Admitting: Family Medicine

## 2022-10-03 VITALS — BP 117/82 | HR 104 | Temp 101.0°F | Resp 16 | Ht 65.0 in | Wt 190.3 lb

## 2022-10-03 DIAGNOSIS — U071 COVID-19: Secondary | ICD-10-CM | POA: Diagnosis not present

## 2022-10-03 DIAGNOSIS — J029 Acute pharyngitis, unspecified: Secondary | ICD-10-CM | POA: Diagnosis not present

## 2022-10-03 DIAGNOSIS — R0981 Nasal congestion: Secondary | ICD-10-CM | POA: Diagnosis not present

## 2022-10-03 LAB — POCT RAPID STREP A (OFFICE): Rapid Strep A Screen: NEGATIVE

## 2022-10-03 LAB — POC COVID19 BINAXNOW: SARS Coronavirus 2 Ag: POSITIVE — AB

## 2022-10-03 LAB — POCT INFLUENZA A/B
Influenza A, POC: NEGATIVE
Influenza B, POC: NEGATIVE

## 2022-10-03 NOTE — Patient Instructions (Addendum)
COVID + today  Over the counter medications that may be helpful for symptoms:   Guaifenesin 1200 mg extended release tabs twice daily, with plenty of water For cough and congestion Brand name: Mucinex   Pseudoephedrine 30 mg, one or two tabs every 4 to 6 hours For sinus congestion Brand name: Sudafed You must get this from the pharmacy counter.  Oxymetazoline nasal spray each morning, one spray in each nostril, for NO MORE THAN 3 days  For nasal and sinus congestion Brand name: Afrin Saline nasal spray or Saline Nasal Irrigation 3-5 times a day For nasal and sinus congestion Brand names: Ocean or AYR Fluticasone nasal spray, one spray in each nostril, each morning (after oxymetazoline and saline, if used) For nasal and sinus congestion Brand name: Flonase Warm salt water gargles  For sore throat Every few hours as needed Alternate ibuprofen 400-600 mg and acetaminophen 1000 mg every 4-6 hours For fever, body aches, headache Brand names: Motrin or Advil and Tylenol Dextromethorphan 12-hour cough version 30 mg every 12 hours  For cough Brand name: Delsym Stop all other cold medications for now (Nyquil, Dayquil, Tylenol Cold, Theraflu, etc) and other non-prescription cough/cold preparations. Many of these have the same ingredients listed above and could cause an overdose of medication.   Herbal treatments that have been shown to be helpful in some patients include: Vitamin C '1000mg'$  per day Vitamin D 4000iU per day Zinc '100mg'$  per day Quercetin 25-'500mg'$  twice a day Melatonin 5-'10mg'$  at bedtime  General Instructions Allow your body to rest Drink PLENTY of fluids Isolate yourself from everyone, even family, until test results have returned  If your COVID-19 test is positive Then you ARE INFECTED and you can pass the virus to others You must quarantine from others for a minimum of  5 days since symptoms started AND You are fever free for 24 hours WITHOUT any medication to  reduce fever AND Your symptoms are improving Wear mask for the next 5 days If not improved by day 5, quarantine for the full 10 days. Do not go to the store or other public areas Do not go around household members who are not known to be infected with COVID-19 If you MUST leave your area of quarantine (example: go to a bathroom you share with others in your home), you must Wear a mask Wash your hands thoroughly Wipe down any surfaces you touch Do not share food, drinks, towels, or other items with other persons Dispose of your own tissues, food containers, etc  Once you have recovered, please continue good preventive care measures, including:  wearing a mask when in public wash your hands frequently avoid touching your face/nose/eyes cover coughs/sneezes with the inside of your elbow stay out of crowds keep a 6 foot distance from others  If you develop severe shortness of breath, uncontrolled fevers, coughing up blood, confusion, chest pain, or signs of dehydration (such as significantly decreased urine amounts or dizziness with standing) please go to the ER.

## 2022-10-03 NOTE — Progress Notes (Signed)
   Acute Office Visit  Subjective:     Patient ID: Kimberly Orr, female    DOB: Jun 13, 1986, 36 y.o.   MRN: [id number]  CC: uri    HPI  Upper Respiratory Infection: Patient complains of symptoms of a URI. Symptoms include  sore throat, headache, fatigue, achy, fever . Onset of symptoms was 1 day ago, gradually worsening since that time.  She is drinking plenty of fluids. Evaluation to date: none. Treatment to date:  ibuprofen . Denies chest pain, dyspnea, GI/GU, loss of taste or smell. Negative COVID test at home.     ROS All review of systems negative except what is listed in the HPI      Objective:    BP 117/82   Pulse (!) 104   Temp (!) 101 F (38.3 C)   Resp 16   Ht '5\' 5"'$  (1.651 m)   Wt 190 lb 4.8 oz (86.3 kg)   SpO2 100%   BMI 31.67 kg/m    Physical Exam Vitals reviewed.  Constitutional:      Appearance: Normal appearance.  HENT:     Head: Normocephalic and atraumatic.  Cardiovascular:     Rate and Rhythm: Normal rate and regular rhythm.     Pulses: Normal pulses.     Heart sounds: Normal heart sounds.  Pulmonary:     Effort: Pulmonary effort is normal.     Breath sounds: Normal breath sounds.  Skin:    General: Skin is warm and dry.  Neurological:     Mental Status: She is alert and oriented to person, place, and time.  Psychiatric:        Mood and Affect: Mood normal.        Behavior: Behavior normal.        Thought Content: Thought content normal.        Judgment: Judgment normal.        Results for orders placed or performed in visit on 10/03/22  POCT rapid strep A  Result Value Ref Range   Rapid Strep A Screen Negative Negative  POC COVID-19  Result Value Ref Range   SARS Coronavirus 2 Ag Positive (A) Negative  POCT Influenza A/B  Result Value Ref Range   Influenza A, POC Negative Negative   Influenza B, POC Negative Negative        Assessment & Plan:   Problem List Items Addressed This Visit   None Visit Diagnoses      Sore throat    -  Primary   Relevant Orders   POCT rapid strep A (Completed)   POC COVID-19 (Completed)   POCT Influenza A/B   Nasal congestion       Relevant Orders   POCT rapid strep A (Completed)   POC COVID-19 (Completed)   POCT Influenza A/B   COVID-19      Flu and Strep negative COVID + Declines antiviral therapy at this time and reports she has appropriate OTC meds at home and does not need any prescriptions at this time.  Continue supportive measures including rest, hydration, humidifier use, steam showers, warm compresses to sinuses, warm liquids with lemon and honey, and over-the-counter cough, cold, and analgesics as needed.   Information on AVS Patient aware of signs/symptoms requiring further/urgent evaluation.        No orders of the defined types were placed in this encounter.   Return if symptoms worsen or fail to improve.  Terrilyn Saver, NP

## 2022-10-10 ENCOUNTER — Other Ambulatory Visit: Payer: Self-pay | Admitting: Family Medicine

## 2022-10-10 ENCOUNTER — Other Ambulatory Visit (HOSPITAL_BASED_OUTPATIENT_CLINIC_OR_DEPARTMENT_OTHER): Payer: Self-pay

## 2022-10-10 DIAGNOSIS — E669 Obesity, unspecified: Secondary | ICD-10-CM

## 2022-10-10 MED ORDER — WEGOVY 2.4 MG/0.75ML ~~LOC~~ SOAJ
2.4000 mg | SUBCUTANEOUS | 0 refills | Status: DC
  Filled 2022-10-10: qty 3, 28d supply, fill #0

## 2022-11-13 ENCOUNTER — Other Ambulatory Visit: Payer: Self-pay | Admitting: Obstetrics and Gynecology

## 2022-11-13 DIAGNOSIS — N6310 Unspecified lump in the right breast, unspecified quadrant: Secondary | ICD-10-CM

## 2022-11-20 ENCOUNTER — Other Ambulatory Visit (HOSPITAL_BASED_OUTPATIENT_CLINIC_OR_DEPARTMENT_OTHER): Payer: Self-pay

## 2022-11-20 ENCOUNTER — Other Ambulatory Visit: Payer: Self-pay | Admitting: Family Medicine

## 2022-11-20 DIAGNOSIS — E669 Obesity, unspecified: Secondary | ICD-10-CM

## 2022-11-20 MED ORDER — WEGOVY 2.4 MG/0.75ML ~~LOC~~ SOAJ
2.4000 mg | SUBCUTANEOUS | 0 refills | Status: DC
  Filled 2022-11-20: qty 3, 28d supply, fill #0

## 2022-11-21 ENCOUNTER — Encounter: Payer: Self-pay | Admitting: Family Medicine

## 2022-11-21 ENCOUNTER — Other Ambulatory Visit: Payer: Self-pay

## 2022-11-21 ENCOUNTER — Telehealth: Payer: Self-pay

## 2022-11-21 NOTE — Telephone Encounter (Signed)
PA initiated via Covermymeds; KEY: [id number]. Awaiting determination.

## 2022-11-22 ENCOUNTER — Other Ambulatory Visit (HOSPITAL_BASED_OUTPATIENT_CLINIC_OR_DEPARTMENT_OTHER): Payer: Self-pay

## 2022-11-23 ENCOUNTER — Other Ambulatory Visit (HOSPITAL_BASED_OUTPATIENT_CLINIC_OR_DEPARTMENT_OTHER): Payer: Self-pay

## 2022-11-23 NOTE — Telephone Encounter (Signed)
Called informed the patient of PA approval. 

## 2022-11-23 NOTE — Telephone Encounter (Signed)
PA approved.    The request has been approved. The authorization is effective from 11/23/2022 to 11/23/2023, as long as the member is enrolled in their current health plan. The request was reviewed and approved by a licensed clinical pharmacist. This request is approved for 38m (4 pens) per 28 days.Additional prior authorizations have been entered for the following:1) Wegovy 0.'25mg'$ /0.566m(3(75830 allowing 67m75m4 pens) per 28 days 2) Wegovy 0.'5mg'$ /0.5mL53m50(74600llowing 67mL 70mpens) per 28 days 3) Wegovy '1mg'$ /0.5mL (24m88(29847owing 67mL (474mns) per 28 days 4) Wegovy 1.'7mg'$ /0.75mL (331m),(30856ing 3mL (4 p61m) per 28 days These prior authorizations are effective from 11/23/2022 to 11/23/2023. A written notification letter will follow with additional details.

## 2022-12-03 ENCOUNTER — Ambulatory Visit (INDEPENDENT_AMBULATORY_CARE_PROVIDER_SITE_OTHER): Payer: Commercial Managed Care - PPO | Admitting: Family Medicine

## 2022-12-03 ENCOUNTER — Encounter: Payer: Self-pay | Admitting: Family Medicine

## 2022-12-03 VITALS — BP 105/68 | HR 70 | Temp 97.6°F | Ht 65.0 in | Wt 188.5 lb

## 2022-12-03 DIAGNOSIS — E669 Obesity, unspecified: Secondary | ICD-10-CM

## 2022-12-03 NOTE — Patient Instructions (Signed)
Keep the diet clean and stay active.  Strong work with your weight loss.   Let us know if you need anything.

## 2022-12-03 NOTE — Progress Notes (Signed)
Chief Complaint  Patient presents with   Follow-up    6 month    Subjective: Patient is a 37 y.o. female here for f/u.  Taking Wegovy 2.4 mg/week. Reports compliance, no AE's. Cravings down, satiety increased. She is down 13 lbs. Diet healthy. Exercise has been limited. No CP or SOB.    Past Medical History:  Diagnosis Date   Anemia    Basal cell carcinoma    Depression    GERD (gastroesophageal reflux disease)    History of chicken pox    Joint pain    Weight gain     Objective: BP 105/68 (BP Location: Left Arm, Patient Position: Sitting, Cuff Size: Normal)   Pulse 70   Temp 97.6 F (36.4 C) (Oral)   Ht 5' 5"$  (1.651 m)   Wt 188 lb 8 oz (85.5 kg)   SpO2 98%   BMI 31.37 kg/m  General: Awake, appears stated age Heart: RRR, no LE edema Lungs: CTAB, no rales, wheezes or rhonchi. No accessory muscle use Psych: Age appropriate judgment and insight, normal affect and mood  Assessment and Plan: Obesity (BMI 30-39.9)  Chronic, stable. Cont Wegovy 2.4 mg/d. Counseled on diet/exercise. F/u in 6 mo or prn.  The patient voiced understanding and agreement to the plan.  [place], DO 12/03/22  1:33 PM

## 2022-12-06 ENCOUNTER — Ambulatory Visit: Payer: Commercial Managed Care - PPO | Admitting: Obstetrics and Gynecology

## 2022-12-11 ENCOUNTER — Other Ambulatory Visit: Payer: Self-pay | Admitting: Family Medicine

## 2022-12-11 ENCOUNTER — Other Ambulatory Visit (HOSPITAL_BASED_OUTPATIENT_CLINIC_OR_DEPARTMENT_OTHER): Payer: Self-pay

## 2022-12-11 DIAGNOSIS — E669 Obesity, unspecified: Secondary | ICD-10-CM

## 2022-12-11 MED ORDER — WEGOVY 2.4 MG/0.75ML ~~LOC~~ SOAJ
2.4000 mg | SUBCUTANEOUS | 0 refills | Status: DC
  Filled 2022-12-11 – 2022-12-17 (×2): qty 3, 28d supply, fill #0

## 2022-12-17 ENCOUNTER — Ambulatory Visit: Payer: 59 | Admitting: Family Medicine

## 2022-12-17 ENCOUNTER — Other Ambulatory Visit (HOSPITAL_BASED_OUTPATIENT_CLINIC_OR_DEPARTMENT_OTHER): Payer: Self-pay

## 2022-12-17 ENCOUNTER — Other Ambulatory Visit: Payer: Self-pay

## 2022-12-24 ENCOUNTER — Encounter: Payer: Self-pay | Admitting: Obstetrics and Gynecology

## 2022-12-24 ENCOUNTER — Other Ambulatory Visit (HOSPITAL_COMMUNITY)
Admission: RE | Admit: 2022-12-24 | Discharge: 2022-12-24 | Disposition: A | Discharge status: Home / Self Care | Payer: Commercial Managed Care - PPO | Source: Ambulatory Visit | Attending: Obstetrics and Gynecology | Admitting: Obstetrics and Gynecology

## 2022-12-24 ENCOUNTER — Ambulatory Visit (INDEPENDENT_AMBULATORY_CARE_PROVIDER_SITE_OTHER): Payer: Commercial Managed Care - PPO | Admitting: Obstetrics and Gynecology

## 2022-12-24 VITALS — BP 103/70 | HR 76 | Ht 65.0 in | Wt 188.0 lb

## 2022-12-24 DIAGNOSIS — N898 Other specified noninflammatory disorders of vagina: Secondary | ICD-10-CM

## 2022-12-24 DIAGNOSIS — Z01419 Encounter for gynecological examination (general) (routine) without abnormal findings: Secondary | ICD-10-CM

## 2022-12-24 NOTE — Progress Notes (Signed)
ANNUAL EXAM Patient name: Kimberly Orr MRN [id number]  Date of birth: [birth year] Chief Complaint:   Annual Exam  History of Present Illness:   Kimberly Orr is a 37 y.o. [id number] with Patient's last menstrual period was 12/05/2022. being seen today for a routine annual exam.  Current complaints:  - Bloating - Vaginal discharge. Possible odor, but improving  Upstream - 12/24/22 1718       Pregnancy Intention Screening   Does the patient want to become pregnant in the next year? No    Does the patient's partner want to become pregnant in the next year? No    Would the patient like to discuss contraceptive options today? N/A      Contraception Wrap Up   Current Method Vasectomy    End Method Vasectomy    Contraception Counseling Provided No    How was the end contraceptive method provided? N/A            The pregnancy intention screening data noted above was reviewed. Potential methods of contraception were discussed. The patient elected to proceed with Vasectomy.   Last pap 06/20/20. Results were: NILM w/ HRHPV negative.  Last mammogram: n/a. Results were: N/A. Family h/o breast cancer: yes grandmother Last colonoscopy: n/a. Results were: N/A. Family h/o colorectal cancer: no     12/03/2022    1:12 PM 04/23/2022    2:22 PM 04/10/2021    3:02 PM 04/20/2020    7:33 AM 04/05/2020   12:50 PM  Depression screen PHQ 2/9  Decreased Interest 0 0 0 1 0  Down, Depressed, Hopeless 0 0 0 2 0  PHQ - 2 Score 0 0 0 3 0  Altered sleeping 0 0  1   Tired, decreased energy 0 1  2   Change in appetite 0 0  2   Feeling bad or failure about yourself  0 0  1   Trouble concentrating 0 0  1   Moving slowly or fidgety/restless 0 0  1   Suicidal thoughts 0 0  0   PHQ-9 Score 0 1  11   Difficult doing work/chores Not difficult at all Not difficult at all  Somewhat difficult          No data to display          Review of Systems:   Pertinent items are noted in HPI Denies any  headaches, blurred vision, fatigue, shortness of breath, chest pain, abdominal pain, abnormal vaginal discharge/itching/odor/irritation, problems with periods, bowel movements, urination, or intercourse unless otherwise stated above. Pertinent History Reviewed:  Reviewed past medical,surgical, social and family history.  Reviewed problem list, medications and allergies. Physical Assessment:   Vitals:   12/24/22 1430  BP: 103/70  Pulse: 76  Weight: 188 lb (85.3 kg)  Height: '5\' 5"'$  (1.651 m)  Body mass index is 31.28 kg/m.        Physical Examination:   General appearance - well appearing, and in no distress  Mental status - alert, oriented to person, place, and time  Chest - respiratory effort normal  Heart - normal peripheral perfusion  Breasts - breasts appear normal, no suspicious masses, no skin or nipple changes or axillary nodes  Abdomen - soft, nontender, nondistended, no masses or organomegaly  Pelvic - VULVA: normal appearing vulva with no masses, tenderness or lesions   CERVIX: no CMT  UTERUS: uterus is felt to be normal size, shape, consistency and nontender   ADNEXA: No adnexal masses  or tenderness noted.  Chaperone present for exam  No results found for this or any previous visit (from the past 24 hour(s)).  Assessment & Plan:  1) Well-Woman Exam Mammogram: Scheduled for diagnostic mammogram & breast US on 01/07/23 for BI-RADS 3 lesion previously visualized.  Colonoscopy: @ 37yo, or sooner if problems Pap: Next due 2026 GC/CT: declines HIV/HCV: declines  2) Vaginal discharge - BV/yeast collected  Follow-up: for annual or sooner prn  Inez Catalina, MD 12/24/2022 5:18 PM

## 2022-12-24 NOTE — Progress Notes (Signed)
Last pap 8/21

## 2022-12-25 LAB — CERVICOVAGINAL ANCILLARY ONLY
Bacterial Vaginitis (gardnerella): NEGATIVE
Candida Glabrata: NEGATIVE
Candida Vaginitis: NEGATIVE
Comment: NEGATIVE
Comment: NEGATIVE
Comment: NEGATIVE

## 2023-01-07 ENCOUNTER — Ambulatory Visit
Admission: RE | Admit: 2023-01-07 | Discharge: 2023-01-07 | Disposition: A | Discharge status: Home / Self Care | Payer: Commercial Managed Care - PPO | Source: Ambulatory Visit | Attending: Obstetrics and Gynecology | Admitting: Obstetrics and Gynecology

## 2023-01-07 ENCOUNTER — Other Ambulatory Visit: Payer: Self-pay | Admitting: Obstetrics and Gynecology

## 2023-01-07 DIAGNOSIS — N6324 Unspecified lump in the left breast, lower inner quadrant: Secondary | ICD-10-CM | POA: Diagnosis not present

## 2023-01-07 DIAGNOSIS — N6310 Unspecified lump in the right breast, unspecified quadrant: Secondary | ICD-10-CM

## 2023-01-07 DIAGNOSIS — N6322 Unspecified lump in the left breast, upper inner quadrant: Secondary | ICD-10-CM | POA: Diagnosis not present

## 2023-01-07 DIAGNOSIS — N6311 Unspecified lump in the right breast, upper outer quadrant: Secondary | ICD-10-CM | POA: Diagnosis not present

## 2023-01-07 DIAGNOSIS — R922 Inconclusive mammogram: Secondary | ICD-10-CM | POA: Diagnosis not present

## 2023-01-08 ENCOUNTER — Other Ambulatory Visit: Payer: Self-pay | Admitting: Obstetrics and Gynecology

## 2023-01-08 DIAGNOSIS — N6002 Solitary cyst of left breast: Secondary | ICD-10-CM

## 2023-01-09 ENCOUNTER — Other Ambulatory Visit: Payer: Self-pay

## 2023-01-09 ENCOUNTER — Other Ambulatory Visit: Payer: Self-pay | Admitting: *Deleted

## 2023-01-09 DIAGNOSIS — R309 Painful micturition, unspecified: Secondary | ICD-10-CM

## 2023-01-09 NOTE — Progress Notes (Signed)
Pt has woke up the last 2 morning with pain upon urination.  She has positive leukocytes,nitrites and blood.  Urine culture sent.

## 2023-01-10 ENCOUNTER — Other Ambulatory Visit (HOSPITAL_BASED_OUTPATIENT_CLINIC_OR_DEPARTMENT_OTHER): Payer: Self-pay

## 2023-01-10 ENCOUNTER — Other Ambulatory Visit: Payer: Self-pay | Admitting: Family Medicine

## 2023-01-10 DIAGNOSIS — E669 Obesity, unspecified: Secondary | ICD-10-CM

## 2023-01-10 MED ORDER — WEGOVY 2.4 MG/0.75ML ~~LOC~~ SOAJ
2.4000 mg | SUBCUTANEOUS | 0 refills | Status: DC
  Filled 2023-01-10: qty 3, 28d supply, fill #0

## 2023-01-11 ENCOUNTER — Encounter: Payer: Self-pay | Admitting: Family Medicine

## 2023-01-11 ENCOUNTER — Other Ambulatory Visit: Payer: Self-pay | Admitting: Family Medicine

## 2023-01-11 ENCOUNTER — Ambulatory Visit (INDEPENDENT_AMBULATORY_CARE_PROVIDER_SITE_OTHER): Payer: Commercial Managed Care - PPO | Admitting: Family Medicine

## 2023-01-11 ENCOUNTER — Other Ambulatory Visit: Payer: Commercial Managed Care - PPO

## 2023-01-11 VITALS — BP 120/70 | HR 83 | Temp 97.6°F | Ht 65.0 in | Wt 188.5 lb

## 2023-01-11 DIAGNOSIS — M546 Pain in thoracic spine: Secondary | ICD-10-CM

## 2023-01-11 LAB — URINALYSIS, MICROSCOPIC ONLY

## 2023-01-11 LAB — SPECIMEN STATUS REPORT

## 2023-01-11 LAB — URINE CULTURE

## 2023-01-11 NOTE — Progress Notes (Signed)
Musculoskeletal Exam  Patient: Kimberly Orr DOB: 1986-01-08  DOS: 01/11/2023  SUBJECTIVE:  Chief Complaint:   Chief Complaint  Patient presents with   Hip Pain    Kimberly Orr is a 37 y.o.  female for evaluation and treatment of flank pain.   Onset:  4 days ago. No inj or change in activity.  Location: flank b/l Character:  dull and sore   Progression of issue:  is unchanged Associated symptoms: no urinary complaints, fevers, redness, bruising, swelling Treatment: to date has been pushing fluids.   Neurovascular symptoms: no  Past Medical History:  Diagnosis Date   Anemia    Basal cell carcinoma    Depression    GERD (gastroesophageal reflux disease)    History of chicken pox    Joint pain     Objective: VITAL SIGNS: BP 120/70 (BP Location: Left Arm, Patient Position: Sitting, Cuff Size: Normal)   Pulse 83   Temp 97.6 F (36.4 C) (Oral)   Ht 5\' 5"  (1.651 m)   Wt 188 lb 8 oz (85.5 kg)   LMP 12/05/2022   SpO2 96%   BMI 31.37 kg/m  Constitutional: Well formed, well developed. No acute distress. Thorax & Lungs: No accessory muscle use Musculoskeletal: Thoracic spine.   Tenderness to palpation: Yes over the mid thoracic paraspinal musculature Deformity: no Ecchymosis: no No deformity or skin lesions/changes Neurologic: Normal sensory function. No focal deficits noted. DTR's equal and symmetric in UE's/LE's. No clonus. Psychiatric: Normal mood. Age appropriate judgment and insight. Alert & oriented x 3.    Assessment:  Acute midline thoracic back pain - Plan: Urine Microscopic Only  Plan: She did mention blood being present in a urinalysis done 2 days ago.  Urine culture was unremarkable.  Will follow-up on this with microscopy.  For her back, stretches/exercises, heat, ice, Tylenol.  It is unlikely infection is contributing.  Low likelihood for kidney stone. F/u as originally scheduled. The patient voiced understanding and agreement to the  plan.   [place], DO 01/11/23  11:20 AM

## 2023-01-11 NOTE — Patient Instructions (Signed)
Ice/cold pack over area for 10-15 min twice daily. ° °Heat (pad or rice pillow in microwave) over affected area, 10-15 minutes twice daily.  ° °OK to take Tylenol 1000 mg (2 extra strength tabs) or 975 mg (3 regular strength tabs) every 6 hours as needed. ° °Let us know if you need anything. ° °EXERCISES  °RANGE OF MOTION (ROM) AND STRETCHING EXERCISES - Low Back Pain °Most people with lower back pain will find that their symptoms get worse with excessive bending forward (flexion) or arching at the lower back (extension). The exercises that will help resolve your symptoms will focus on the opposite motion.  °If you have pain, numbness or tingling which travels down into your buttocks, leg or foot, the goal of the therapy is for these symptoms to move closer to your back and eventually resolve. Sometimes, these leg symptoms will get better, but your lower back pain may worsen. This is often an indication of progress in your rehabilitation. Be very alert to any changes in your symptoms and the activities in which you participated in the 24 hours prior to the change. Sharing this information with your caregiver will allow him or her to most efficiently treat your condition. °These exercises may help you when beginning to rehabilitate your injury. Your symptoms may resolve with or without further involvement from your physician, physical therapist or athletic trainer. While completing these exercises, remember:  °Restoring tissue flexibility helps normal motion to return to the joints. This allows healthier, less painful movement and activity. °An effective stretch should be held for at least 30 seconds. °A stretch should never be painful. You should only feel a gentle lengthening or release in the stretched tissue. °FLEXION RANGE OF MOTION AND STRETCHING EXERCISES: ° °STRETCH - Flexion, Single Knee to Chest  °Lie on a firm bed or floor with both legs extended in front of you. °Keeping one leg in contact with the floor,  bring your opposite knee to your chest. Hold your leg in place by either grabbing behind your thigh or at your knee. °Pull until you feel a gentle stretch in your low back. Hold 30 seconds. °Slowly release your grasp and repeat the exercise with the opposite side. °Repeat 2 times. Complete this exercise 3 times per week.  ° °STRETCH - Flexion, Double Knee to Chest °Lie on a firm bed or floor with both legs extended in front of you. °Keeping one leg in contact with the floor, bring your opposite knee to your chest. °Tense your stomach muscles to support your back and then lift your other knee to your chest. Hold your legs in place by either grabbing behind your thighs or at your knees. °Pull both knees toward your chest until you feel a gentle stretch in your low back. Hold 30 seconds. °Tense your stomach muscles and slowly return one leg at a time to the floor. °Repeat 2 times. Complete this exercise 3 times per week.  ° °STRETCH - Low Trunk Rotation °Lie on a firm bed or floor. Keeping your legs in front of you, bend your knees so they are both pointed toward the ceiling and your feet are flat on the floor. °Extend your arms out to the side. This will stabilize your upper body by keeping your shoulders in contact with the floor. °Gently and slowly drop both knees together to one side until you feel a gentle stretch in your low back. Hold for 30 seconds. °Tense your stomach muscles to support your lower back   as you bring your knees back to the starting position. Repeat the exercise to the other side. °Repeat 2 times. Complete this exercise at least 3 times per week. °  °EXTENSION RANGE OF MOTION AND FLEXIBILITY EXERCISES: ° °STRETCH - Extension, Prone on Elbows  °Lie on your stomach on the floor, a bed will be too soft. Place your palms about shoulder width apart and at the height of your head. °Place your elbows under your shoulders. If this is too painful, stack pillows under your chest. °Allow your body to relax  so that your hips drop lower and make contact more completely with the floor. °Hold this position for 30 seconds. °Slowly return to lying flat on the floor. °Repeat 2 times. Complete this exercise 3 times per week.  ° °RANGE OF MOTION - Extension, Prone Press Ups °Lie on your stomach on the floor, a bed will be too soft. Place your palms about shoulder width apart and at the height of your head. °Keeping your back as relaxed as possible, slowly straighten your elbows while keeping your hips on the floor. You may adjust the placement of your hands to maximize your comfort. As you gain motion, your hands will come more underneath your shoulders. °Hold this position 30 seconds. °Slowly return to lying flat on the floor. °Repeat 2 times. Complete this exercise 3 times per week.  ° °RANGE OF MOTION- Quadruped, Neutral Spine  °Assume a hands and knees position on a firm surface. Keep your hands under your shoulders and your knees under your hips. You may place padding under your knees for comfort. °Drop your head and point your tailbone toward the ground below you. This will round out your lower back like an angry cat. Hold this position for 30 seconds. °Slowly lift your head and release your tail bone so that your back sags into a large arch, like an old horse. °Hold this position for 30 seconds. °Repeat this until you feel limber in your low back. °Now, find your "sweet spot." This will be the most comfortable position somewhere between the two previous positions. This is your neutral spine. Once you have found this position, tense your stomach muscles to support your low back. °Hold this position for 30 seconds. °Repeat 2 times. Complete this exercise 3 times per week.  ° °STRENGTHENING EXERCISES - Low Back Sprain °These exercises may help you when beginning to rehabilitate your injury. These exercises should be done near your "sweet spot." This is the neutral, low-back arch, somewhere between fully rounded and fully  arched, that is your least painful position. When performed in this safe range of motion, these exercises can be used for people who have either a flexion or extension based injury. These exercises may resolve your symptoms with or without further involvement from your physician, physical therapist or athletic trainer. While completing these exercises, remember:  °Muscles can gain both the endurance and the strength needed for everyday activities through controlled exercises. °Complete these exercises as instructed by your physician, physical therapist or athletic trainer. Increase the resistance and repetitions only as guided. °You may experience muscle soreness or fatigue, but the pain or discomfort you are trying to eliminate should never worsen during these exercises. If this pain does worsen, stop and make certain you are following the directions exactly. If the pain is still present after adjustments, discontinue the exercise until you can discuss the trouble with your caregiver. ° °STRENGTHENING - Deep Abdominals, Pelvic Tilt  °Lie on a firm   bed or floor. Keeping your legs in front of you, bend your knees so they are both pointed toward the ceiling and your feet are flat on the floor. °Tense your lower abdominal muscles to press your low back into the floor. This motion will rotate your pelvis so that your tail bone is scooping upwards rather than pointing at your feet or into the floor. °With a gentle tension and even breathing, hold this position for 3 seconds. °Repeat 2 times. Complete this exercise 3 times per week.  ° °STRENGTHENING - Abdominals, Crunches  °Lie on a firm bed or floor. Keeping your legs in front of you, bend your knees so they are both pointed toward the ceiling and your feet are flat on the floor. Cross your arms over your chest. °Slightly tip your chin down without bending your neck. °Tense your abdominals and slowly lift your trunk high enough to just clear your shoulder blades.  Lifting higher can put excessive stress on the lower back and does not further strengthen your abdominal muscles. °Control your return to the starting position. °Repeat 2 times. Complete this exercise 3 times per week.  ° °STRENGTHENING - Quadruped, Opposite UE/LE Lift  °Assume a hands and knees position on a firm surface. Keep your hands under your shoulders and your knees under your hips. You may place padding under your knees for comfort. °Find your neutral spine and gently tense your abdominal muscles so that you can maintain this position. Your shoulders and hips should form a rectangle that is parallel with the floor and is not twisted. °Keeping your trunk steady, lift your right hand no higher than your shoulder and then your left leg no higher than your hip. Make sure you are not holding your breath. Hold this position for 30 seconds. °Continuing to keep your abdominal muscles tense and your back steady, slowly return to your starting position. Repeat with the opposite arm and leg. °Repeat 2 times. Complete this exercise 3 times per week.  ° °STRENGTHENING - Abdominals and Quadriceps, Straight Leg Raise  °Lie on a firm bed or floor with both legs extended in front of you. °Keeping one leg in contact with the floor, bend the other knee so that your foot can rest flat on the floor. °Find your neutral spine, and tense your abdominal muscles to maintain your spinal position throughout the exercise. °Slowly lift your straight leg off the floor about 6 inches for a count of 3, making sure to not hold your breath. °Still keeping your neutral spine, slowly lower your leg all the way to the floor. °Repeat this exercise with each leg 2 times. Complete this exercise 3 times per week. ° °POSTURE AND BODY MECHANICS CONSIDERATIONS - Low Back Sprain °Keeping correct posture when sitting, standing or completing your activities will reduce the stress put on different body tissues, allowing injured tissues a chance to heal  and limiting painful experiences. The following are general guidelines for improved posture.  While reading these guidelines, remember: °The exercises prescribed by your provider will help you have the flexibility and strength to maintain correct postures. °The correct posture provides the best environment for your joints to work. All of your joints have less wear and tear when properly supported by a spine with good posture. This means you will experience a healthier, less painful body. °Correct posture must be practiced with all of your activities, especially prolonged sitting and standing. Correct posture is as important when doing repetitive low-stress activities (typing) as   it is when doing a single heavy-load activity (lifting). ° °RESTING POSITIONS °Consider which positions are most painful for you when choosing a resting position. If you have pain with flexion-based activities (sitting, bending, stooping, squatting), choose a position that allows you to rest in a less flexed posture. You would want to avoid curling into a fetal position on your side. If your pain worsens with extension-based activities (prolonged standing, working overhead), avoid resting in an extended position such as sleeping on your stomach. Most people will find more comfort when they rest with their spine in a more neutral position, neither too rounded nor too arched. Lying on a non-sagging bed on your side with a pillow between your knees, or on your back with a pillow under your knees will often provide some relief. Keep in mind, being in any one position for a prolonged period of time, no matter how correct your posture, can still lead to stiffness. ° °PROPER SITTING POSTURE °In order to minimize stress and discomfort on your spine, you must sit with correct posture. Sitting with good posture should be effortless for a healthy body. Returning to good posture is a gradual process. Many people can work toward this most comfortably by  using various supports until they have the flexibility and strength to maintain this posture on their own. °When sitting with proper posture, your ears will fall over your shoulders and your shoulders will fall over your hips. You should use the back of the chair to support your upper back. Your lower back will be in a neutral position, just slightly arched. You may place a small pillow or folded towel at the base of your lower back for  °support.  °When working at a desk, create an environment that supports good, upright posture. Without extra support, muscles tire, which leads to excessive strain on joints and other tissues. Keep these recommendations in mind: ° °CHAIR: °A chair should be able to slide under your desk when your back makes contact with the back of the chair. This allows you to work closely. °The chair's height should allow your eyes to be level with the upper part of your monitor and your hands to be slightly lower than your elbows. ° °BODY POSITION °Your feet should make contact with the floor. If this is not possible, use a foot rest. °Keep your ears over your shoulders. This will reduce stress on your neck and low back. ° °INCORRECT SITTING POSTURES  °If you are feeling tired and unable to assume a healthy sitting posture, do not slouch or slump. This puts excessive strain on your back tissues, causing more damage and pain. Healthier options include: °Using more support, like a lumbar pillow. °Switching tasks to something that requires you to be upright or walking. °Talking a brief walk. °Lying down to rest in a neutral-spine position. ° °PROLONGED STANDING WHILE SLIGHTLY LEANING FORWARD  °When completing a task that requires you to lean forward while standing in one place for a long time, place either foot up on a stationary 2-4 inch high object to help maintain the best posture. When both feet are on the ground, the lower back tends to lose its slight inward curve. If this curve flattens (or  becomes too large), then the back and your other joints will experience too much stress, tire more quickly, and can cause pain. ° °CORRECT STANDING POSTURES °Proper standing posture should be assumed with all daily activities, even if they only take a few moments, like   when brushing your teeth. As in sitting, your ears should fall over your shoulders and your shoulders should fall over your hips. You should keep a slight tension in your abdominal muscles to brace your spine. Your tailbone should point down to the ground, not behind your body, resulting in an over-extended swayback posture.  ° °INCORRECT STANDING POSTURES  °Common incorrect standing postures include a forward head, locked knees and/or an excessive swayback. °WALKING °Walk with an upright posture. Your ears, shoulders and hips should all line-up. ° °PROLONGED ACTIVITY IN A FLEXED POSITION °When completing a task that requires you to bend forward at your waist or lean over a low surface, try to find a way to stabilize 3 out of 4 of your limbs. You can place a hand or elbow on your thigh or rest a knee on the surface you are reaching across. This will provide you more stability, so that your muscles do not tire as quickly. By keeping your knees relaxed, or slightly bent, you will also reduce stress across your lower back. °CORRECT LIFTING TECHNIQUES ° °DO : °Assume a wide stance. This will provide you more stability and the opportunity to get as close as possible to the object which you are lifting. °Tense your abdominals to brace your spine. Bend at the knees and hips. Keeping your back locked in a neutral-spine position, lift using your leg muscles. Lift with your legs, keeping your back straight. °Test the weight of unknown objects before attempting to lift them. °Try to keep your elbows locked down at your sides in order get the best strength from your shoulders when carrying an object. ° ° °Always ask for help when lifting heavy or awkward  objects. °INCORRECT LIFTING TECHNIQUES °DO NOT:  °Lock your knees when lifting, even if it is a small object. °Bend and twist. Pivot at your feet or move your feet when needing to change directions. °Assume that you can safely pick up even a paperclip without proper posture. ° °

## 2023-01-12 LAB — URINE CULTURE
MICRO NUMBER:: 14729587
Result:: NO GROWTH
SPECIMEN QUALITY:: ADEQUATE

## 2023-01-14 ENCOUNTER — Ambulatory Visit
Admission: RE | Admit: 2023-01-14 | Discharge: 2023-01-14 | Disposition: A | Discharge status: Home / Self Care | Payer: Commercial Managed Care - PPO | Source: Ambulatory Visit | Attending: Obstetrics and Gynecology | Admitting: Obstetrics and Gynecology

## 2023-01-14 ENCOUNTER — Other Ambulatory Visit: Payer: Self-pay | Admitting: Obstetrics and Gynecology

## 2023-01-14 ENCOUNTER — Other Ambulatory Visit (HOSPITAL_COMMUNITY): Payer: Self-pay | Admitting: Diagnostic Radiology

## 2023-01-14 DIAGNOSIS — N6002 Solitary cyst of left breast: Secondary | ICD-10-CM

## 2023-01-14 DIAGNOSIS — N6325 Unspecified lump in the left breast, overlapping quadrants: Secondary | ICD-10-CM | POA: Diagnosis not present

## 2023-01-14 HISTORY — PX: BREAST BIOPSY: SHX20

## 2023-01-17 ENCOUNTER — Encounter: Payer: Self-pay | Admitting: Obstetrics and Gynecology

## 2023-02-05 DIAGNOSIS — D229 Melanocytic nevi, unspecified: Secondary | ICD-10-CM | POA: Diagnosis not present

## 2023-02-05 DIAGNOSIS — L814 Other melanin hyperpigmentation: Secondary | ICD-10-CM | POA: Diagnosis not present

## 2023-02-05 DIAGNOSIS — L905 Scar conditions and fibrosis of skin: Secondary | ICD-10-CM | POA: Diagnosis not present

## 2023-02-05 DIAGNOSIS — L570 Actinic keratosis: Secondary | ICD-10-CM | POA: Diagnosis not present

## 2023-02-05 DIAGNOSIS — L578 Other skin changes due to chronic exposure to nonionizing radiation: Secondary | ICD-10-CM | POA: Diagnosis not present

## 2023-02-05 DIAGNOSIS — L738 Other specified follicular disorders: Secondary | ICD-10-CM | POA: Diagnosis not present

## 2023-02-05 DIAGNOSIS — Z85828 Personal history of other malignant neoplasm of skin: Secondary | ICD-10-CM | POA: Diagnosis not present

## 2023-02-07 ENCOUNTER — Other Ambulatory Visit (HOSPITAL_BASED_OUTPATIENT_CLINIC_OR_DEPARTMENT_OTHER): Payer: Self-pay

## 2023-02-07 ENCOUNTER — Other Ambulatory Visit: Payer: Self-pay | Admitting: Family Medicine

## 2023-02-07 DIAGNOSIS — N6322 Unspecified lump in the left breast, upper inner quadrant: Secondary | ICD-10-CM | POA: Diagnosis not present

## 2023-02-07 DIAGNOSIS — E669 Obesity, unspecified: Secondary | ICD-10-CM

## 2023-02-08 ENCOUNTER — Other Ambulatory Visit (HOSPITAL_BASED_OUTPATIENT_CLINIC_OR_DEPARTMENT_OTHER): Payer: Self-pay

## 2023-02-08 MED ORDER — WEGOVY 2.4 MG/0.75ML ~~LOC~~ SOAJ
2.4000 mg | SUBCUTANEOUS | 0 refills | Status: AC
  Filled 2023-02-08: qty 3, 28d supply, fill #0

## 2023-02-09 ENCOUNTER — Other Ambulatory Visit: Payer: Self-pay | Admitting: General Surgery

## 2023-02-09 DIAGNOSIS — N6320 Unspecified lump in the left breast, unspecified quadrant: Secondary | ICD-10-CM

## 2023-02-12 ENCOUNTER — Other Ambulatory Visit: Payer: Self-pay | Admitting: General Surgery

## 2023-02-12 DIAGNOSIS — N6320 Unspecified lump in the left breast, unspecified quadrant: Secondary | ICD-10-CM

## 2023-02-14 ENCOUNTER — Other Ambulatory Visit: Payer: Self-pay | Admitting: *Deleted

## 2023-02-14 DIAGNOSIS — Z8051 Family history of malignant neoplasm of kidney: Secondary | ICD-10-CM

## 2023-02-14 NOTE — Progress Notes (Signed)
Pt here for [facility] labwork only

## 2023-02-26 LAB — EMPOWER BRCA (2): REPORT SUMMARY: NEGATIVE

## 2023-03-06 ENCOUNTER — Encounter (HOSPITAL_BASED_OUTPATIENT_CLINIC_OR_DEPARTMENT_OTHER): Payer: Self-pay | Admitting: General Surgery

## 2023-03-06 ENCOUNTER — Other Ambulatory Visit: Payer: Self-pay

## 2023-03-12 ENCOUNTER — Other Ambulatory Visit: Payer: Self-pay | Admitting: General Surgery

## 2023-03-12 ENCOUNTER — Ambulatory Visit
Admission: RE | Admit: 2023-03-12 | Discharge: 2023-03-12 | Disposition: A | Discharge status: Home / Self Care | Payer: Commercial Managed Care - PPO | Source: Ambulatory Visit | Attending: General Surgery | Admitting: General Surgery

## 2023-03-12 DIAGNOSIS — N6320 Unspecified lump in the left breast, unspecified quadrant: Secondary | ICD-10-CM

## 2023-03-12 DIAGNOSIS — N63 Unspecified lump in unspecified breast: Secondary | ICD-10-CM | POA: Diagnosis not present

## 2023-03-12 HISTORY — PX: BREAST BIOPSY: SHX20

## 2023-03-12 MED ORDER — ENSURE PRE-SURGERY PO LIQD: 296.0000 mL | Freq: Once | ORAL | Status: DC

## 2023-03-12 MED ORDER — CHLORHEXIDINE GLUCONATE CLOTH 2 % EX PADS: 6.0000 | MEDICATED_PAD | Freq: Once | CUTANEOUS | Status: DC

## 2023-03-12 NOTE — Progress Notes (Signed)

## 2023-03-13 ENCOUNTER — Ambulatory Visit (HOSPITAL_BASED_OUTPATIENT_CLINIC_OR_DEPARTMENT_OTHER)
Admission: RE | Admit: 2023-03-13 | Discharge: 2023-03-13 | Disposition: A | Discharge status: Home / Self Care | Payer: Commercial Managed Care - PPO | Attending: General Surgery | Admitting: General Surgery

## 2023-03-13 ENCOUNTER — Encounter (HOSPITAL_BASED_OUTPATIENT_CLINIC_OR_DEPARTMENT_OTHER): Payer: Self-pay | Admitting: General Surgery

## 2023-03-13 ENCOUNTER — Encounter (HOSPITAL_BASED_OUTPATIENT_CLINIC_OR_DEPARTMENT_OTHER)
Admission: RE | Disposition: A | Discharge status: Home / Self Care | Payer: Self-pay | Source: Home / Self Care | Attending: General Surgery

## 2023-03-13 ENCOUNTER — Ambulatory Visit
Admission: RE | Admit: 2023-03-13 | Discharge: 2023-03-13 | Disposition: A | Discharge status: Home / Self Care | Payer: Commercial Managed Care - PPO | Source: Ambulatory Visit | Attending: General Surgery | Admitting: General Surgery

## 2023-03-13 ENCOUNTER — Ambulatory Visit (HOSPITAL_BASED_OUTPATIENT_CLINIC_OR_DEPARTMENT_OTHER): Payer: Commercial Managed Care - PPO | Admitting: Anesthesiology

## 2023-03-13 ENCOUNTER — Other Ambulatory Visit: Payer: Self-pay

## 2023-03-13 DIAGNOSIS — N6489 Other specified disorders of breast: Secondary | ICD-10-CM | POA: Diagnosis not present

## 2023-03-13 DIAGNOSIS — N6002 Solitary cyst of left breast: Secondary | ICD-10-CM | POA: Diagnosis not present

## 2023-03-13 DIAGNOSIS — N6012 Diffuse cystic mastopathy of left breast: Secondary | ICD-10-CM | POA: Diagnosis not present

## 2023-03-13 DIAGNOSIS — N6022 Fibroadenosis of left breast: Secondary | ICD-10-CM

## 2023-03-13 DIAGNOSIS — Z9889 Other specified postprocedural states: Secondary | ICD-10-CM | POA: Diagnosis not present

## 2023-03-13 DIAGNOSIS — N6320 Unspecified lump in the left breast, unspecified quadrant: Secondary | ICD-10-CM

## 2023-03-13 DIAGNOSIS — Z01818 Encounter for other preprocedural examination: Secondary | ICD-10-CM

## 2023-03-13 DIAGNOSIS — Z803 Family history of malignant neoplasm of breast: Secondary | ICD-10-CM | POA: Diagnosis not present

## 2023-03-13 DIAGNOSIS — N6082 Other benign mammary dysplasias of left breast: Secondary | ICD-10-CM | POA: Insufficient documentation

## 2023-03-13 DIAGNOSIS — Z6832 Body mass index (BMI) 32.0-32.9, adult: Secondary | ICD-10-CM | POA: Diagnosis not present

## 2023-03-13 DIAGNOSIS — E669 Obesity, unspecified: Secondary | ICD-10-CM | POA: Diagnosis not present

## 2023-03-13 DIAGNOSIS — D242 Benign neoplasm of left breast: Secondary | ICD-10-CM | POA: Diagnosis not present

## 2023-03-13 HISTORY — PX: RADIOACTIVE SEED GUIDED EXCISIONAL BREAST BIOPSY: SHX6490

## 2023-03-13 LAB — POCT PREGNANCY, URINE: Preg Test, Ur: NEGATIVE

## 2023-03-13 SURGERY — RADIOACTIVE SEED GUIDED BREAST BIOPSY
Anesthesia: General | Site: Breast | Laterality: Left

## 2023-03-13 MED ORDER — ONDANSETRON HCL 4 MG/2ML IJ SOLN
INTRAMUSCULAR | Status: DC | PRN
  Administered 2023-03-13: 4 mg via INTRAVENOUS

## 2023-03-13 MED ORDER — PROPOFOL 10 MG/ML IV BOLUS
INTRAVENOUS | Status: DC | PRN
  Administered 2023-03-13: 200 mg via INTRAVENOUS

## 2023-03-13 MED ORDER — MIDAZOLAM HCL 5 MG/5ML IJ SOLN
INTRAMUSCULAR | Status: DC | PRN
  Administered 2023-03-13: 2 mg via INTRAVENOUS

## 2023-03-13 MED ORDER — PROPOFOL 500 MG/50ML IV EMUL
INTRAVENOUS | Status: DC | PRN
  Administered 2023-03-13: 200 ug/kg/min via INTRAVENOUS

## 2023-03-13 MED ORDER — FENTANYL CITRATE (PF) 100 MCG/2ML IJ SOLN
INTRAMUSCULAR | Status: AC
  Filled 2023-03-13: qty 2

## 2023-03-13 MED ORDER — KETOROLAC TROMETHAMINE 15 MG/ML IJ SOLN
INTRAMUSCULAR | Status: AC
  Filled 2023-03-13: qty 1

## 2023-03-13 MED ORDER — ACETAMINOPHEN 500 MG PO TABS
ORAL_TABLET | ORAL | Status: AC
  Filled 2023-03-13: qty 2

## 2023-03-13 MED ORDER — DEXAMETHASONE SODIUM PHOSPHATE 4 MG/ML IJ SOLN
INTRAMUSCULAR | Status: DC | PRN
  Administered 2023-03-13: 5 mg via INTRAVENOUS

## 2023-03-13 MED ORDER — PROMETHAZINE HCL 25 MG/ML IJ SOLN: 6.2500 mg | INTRAMUSCULAR | Status: DC | PRN

## 2023-03-13 MED ORDER — OXYCODONE HCL 5 MG PO TABS: 5.0000 mg | ORAL_TABLET | Freq: Once | ORAL | Status: DC | PRN

## 2023-03-13 MED ORDER — OXYCODONE HCL 5 MG/5ML PO SOLN: 5.0000 mg | Freq: Once | ORAL | Status: DC | PRN

## 2023-03-13 MED ORDER — FENTANYL CITRATE (PF) 100 MCG/2ML IJ SOLN
INTRAMUSCULAR | Status: DC | PRN
  Administered 2023-03-13: 50 ug via INTRAVENOUS

## 2023-03-13 MED ORDER — LIDOCAINE 2% (20 MG/ML) 5 ML SYRINGE
INTRAMUSCULAR | Status: DC | PRN
  Administered 2023-03-13: 60 mg via INTRAVENOUS

## 2023-03-13 MED ORDER — LACTATED RINGERS IV SOLN: INTRAVENOUS | Status: DC

## 2023-03-13 MED ORDER — AMISULPRIDE (ANTIEMETIC) 5 MG/2ML IV SOLN: 10.0000 mg | Freq: Once | INTRAVENOUS | Status: DC | PRN

## 2023-03-13 MED ORDER — MIDAZOLAM HCL 2 MG/2ML IJ SOLN
INTRAMUSCULAR | Status: AC
  Filled 2023-03-13: qty 2

## 2023-03-13 MED ORDER — KETOROLAC TROMETHAMINE 15 MG/ML IJ SOLN
15.0000 mg | INTRAMUSCULAR | Status: AC
  Administered 2023-03-13: 15 mg via INTRAVENOUS

## 2023-03-13 MED ORDER — ACETAMINOPHEN 500 MG PO TABS
1000.0000 mg | ORAL_TABLET | ORAL | Status: AC
  Administered 2023-03-13: 1000 mg via ORAL

## 2023-03-13 MED ORDER — CEFAZOLIN SODIUM-DEXTROSE 2-4 GM/100ML-% IV SOLN
2.0000 g | INTRAVENOUS | Status: AC
  Administered 2023-03-13: 2 g via INTRAVENOUS

## 2023-03-13 MED ORDER — CEFAZOLIN SODIUM-DEXTROSE 2-4 GM/100ML-% IV SOLN
INTRAVENOUS | Status: AC
  Filled 2023-03-13: qty 100

## 2023-03-13 MED ORDER — BUPIVACAINE HCL (PF) 0.25 % IJ SOLN
INTRAMUSCULAR | Status: DC | PRN
  Administered 2023-03-13: 10 mL

## 2023-03-13 MED ORDER — FENTANYL CITRATE (PF) 100 MCG/2ML IJ SOLN: 25.0000 ug | INTRAMUSCULAR | Status: DC | PRN

## 2023-03-13 SURGICAL SUPPLY — 56 items
ADH SKN CLS APL DERMABOND .7 (GAUZE/BANDAGES/DRESSINGS) ×1
APL PRP STRL LF DISP 70% ISPRP (MISCELLANEOUS) ×1
APPLIER CLIP 9.375 MED OPEN (MISCELLANEOUS)
APR CLP MED 9.3 20 MLT OPN (MISCELLANEOUS)
BINDER BREAST LRG (GAUZE/BANDAGES/DRESSINGS) IMPLANT
BINDER BREAST MEDIUM (GAUZE/BANDAGES/DRESSINGS) IMPLANT
BINDER BREAST XLRG (GAUZE/BANDAGES/DRESSINGS) IMPLANT
BINDER BREAST XXLRG (GAUZE/BANDAGES/DRESSINGS) IMPLANT
BLADE SURG 15 STRL LF DISP TIS (BLADE) ×1 IMPLANT
BLADE SURG 15 STRL SS (BLADE) ×1
CANISTER SUC SOCK COL 7IN (MISCELLANEOUS) IMPLANT
CANISTER SUCT 1200ML W/VALVE (MISCELLANEOUS) IMPLANT
CHLORAPREP W/TINT 26 (MISCELLANEOUS) ×1 IMPLANT
CLIP APPLIE 9.375 MED OPEN (MISCELLANEOUS) IMPLANT
CLIP TI WIDE RED SMALL 6 (CLIP) IMPLANT
COVER BACK TABLE 60X90IN (DRAPES) ×1 IMPLANT
COVER MAYO STAND STRL (DRAPES) ×1 IMPLANT
COVER PROBE CYLINDRICAL 5X96 (MISCELLANEOUS) ×1 IMPLANT
DERMABOND ADVANCED .7 DNX12 (GAUZE/BANDAGES/DRESSINGS) ×1 IMPLANT
DRAPE LAPAROSCOPIC ABDOMINAL (DRAPES) ×1 IMPLANT
DRAPE UTILITY XL STRL (DRAPES) ×1 IMPLANT
DRSG TEGADERM 4X4.75 (GAUZE/BANDAGES/DRESSINGS) IMPLANT
ELECT COATED BLADE 2.86 ST (ELECTRODE) ×1 IMPLANT
ELECT REM PT RETURN 9FT ADLT (ELECTROSURGICAL) ×1
ELECTRODE REM PT RTRN 9FT ADLT (ELECTROSURGICAL) ×1 IMPLANT
GAUZE SPONGE 4X4 12PLY STRL LF (GAUZE/BANDAGES/DRESSINGS) IMPLANT
GLOVE BIO SURGEON STRL SZ7 (GLOVE) ×2 IMPLANT
GLOVE BIOGEL PI IND STRL 7.0 (GLOVE) IMPLANT
GLOVE BIOGEL PI IND STRL 7.5 (GLOVE) ×1 IMPLANT
GLOVE SURG SS PI 7.0 STRL IVOR (GLOVE) IMPLANT
GOWN STRL REUS W/ TWL LRG LVL3 (GOWN DISPOSABLE) ×2 IMPLANT
GOWN STRL REUS W/TWL LRG LVL3 (GOWN DISPOSABLE) ×2
HEMOSTAT ARISTA ABSORB 3G PWDR (HEMOSTASIS) IMPLANT
KIT MARKER MARGIN INK (KITS) ×1 IMPLANT
NDL HYPO 25X1 1.5 SAFETY (NEEDLE) ×1 IMPLANT
NEEDLE HYPO 25X1 1.5 SAFETY (NEEDLE) ×1 IMPLANT
NS IRRIG 1000ML POUR BTL (IV SOLUTION) IMPLANT
PACK BASIN DAY SURGERY FS (CUSTOM PROCEDURE TRAY) ×1 IMPLANT
PENCIL SMOKE EVACUATOR (MISCELLANEOUS) ×1 IMPLANT
RETRACTOR ONETRAX LX 90X20 (MISCELLANEOUS) IMPLANT
SLEEVE SCD COMPRESS KNEE MED (STOCKING) ×1 IMPLANT
SPIKE FLUID TRANSFER (MISCELLANEOUS) IMPLANT
SPONGE T-LAP 4X18 ~~LOC~~+RFID (SPONGE) ×1 IMPLANT
STRIP CLOSURE SKIN 1/2X4 (GAUZE/BANDAGES/DRESSINGS) ×1 IMPLANT
SUT MNCRL AB 4-0 PS2 18 (SUTURE) IMPLANT
SUT MON AB 5-0 PS2 18 (SUTURE) IMPLANT
SUT SILK 2 0 SH (SUTURE) IMPLANT
SUT VIC AB 2-0 SH 27 (SUTURE) ×1
SUT VIC AB 2-0 SH 27XBRD (SUTURE) ×1 IMPLANT
SUT VIC AB 3-0 SH 27 (SUTURE) ×1
SUT VIC AB 3-0 SH 27X BRD (SUTURE) ×1 IMPLANT
SYR CONTROL 10ML LL (SYRINGE) ×1 IMPLANT
TOWEL GREEN STERILE FF (TOWEL DISPOSABLE) ×1 IMPLANT
TRAY FAXITRON CT DISP (TRAY / TRAY PROCEDURE) ×1 IMPLANT
TUBE CONNECTING 20X1/4 (TUBING) IMPLANT
YANKAUER SUCT BULB TIP NO VENT (SUCTIONS) IMPLANT

## 2023-03-13 NOTE — Anesthesia Procedure Notes (Signed)
Procedure Name: LMA Insertion Date/Time: 03/13/2023 3:22 AM  Performed by: Burna Cash, CRNAPre-anesthesia Checklist: Patient identified, Emergency Drugs available, Suction available and Patient being monitored Patient Re-evaluated:Patient Re-evaluated prior to induction Oxygen Delivery Method: Circle system utilized Preoxygenation: Pre-oxygenation with 100% oxygen Induction Type: IV induction Ventilation: Mask ventilation without difficulty LMA: LMA inserted LMA Size: 4.0 Number of attempts: 1 Airway Equipment and Method: Bite block Placement Confirmation: positive ETCO2 Tube secured with: Tape Dental Injury: Teeth and Oropharynx as per pre-operative assessment

## 2023-03-13 NOTE — H&P (Signed)
  37 yof undergoing short term follow up for a right breast mass. This is a benign cluster of cysts and has remained stable however there is a new 1.1 cm mass in the left breast. This has undergone a biopsy and this shows benign tissue with PASH. This is discordant and she has been referred for options. 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 She has fh in pgm and fh of renal call as well.  Review of Systems: A complete review of systems was obtained from the patient. I have reviewed this information and discussed as appropriate with the patient. See HPI as well for other ROS.  Review of Systems Psychiatric/Behavioral: The patient is nervous/anxious. All other systems reviewed and are negative.  Medical History: History reviewed. No pertinent past medical history.  Past Surgical History: Procedure Laterality Date Disectomy 02/2001  No Known Allergies  Current Outpatient Medications on File Prior to Visit Medication Sig Dispense Refill semaglutide (WEGOVY) 0.5 mg/0.5 mL pen injector Inject subcutaneously  Family History Problem Relation Age of Onset Skin cancer Mother Skin cancer Father Breast cancer Paternal Grandmother  Social History  Tobacco Use Smoking Status Never Smokeless Tobacco Never Marital status: Married Tobacco Use Smoking status: Never Smokeless tobacco: Never Vaping Use Vaping status: Never Used Substance and Sexual Activity Alcohol use: Not Currently Drug use: Never  Objective:  Physical Exam Vitals reviewed. Constitutional: Appearance: Normal appearance. Chest: Breasts: Right: No inverted nipple, mass or nipple discharge. Left: No inverted nipple, mass or nipple discharge. Lymphadenopathy: Upper Body: Right upper body: No supraclavicular or axillary adenopathy. Left upper body: No supraclavicular or axillary adenopathy. Neurological: Mental Status: She is alert.   Assessment and Plan:  Mass of upper inner quadrant of left  breast  Left breast seed guided excisional biopsy  Discussed biopsy results and imaging findings with discordance. Option to observe but this is not ideal in her age group with a mass. Discussed excisional biopsy with seed guidance risks, recovery. Will proceed to schedule

## 2023-03-13 NOTE — Discharge Instructions (Addendum)
[facility] Office Phone Number [phone]  POST OP INSTRUCTIONS Take 400 mg of ibuprofen every 8 hours or 650 mg tylenol every 6 hours for next 72 hours then as needed. Use ice several times daily also.  A prescription for pain medication may be given to you upon discharge.  Take your pain medication as prescribed, if needed.  If narcotic pain medicine is not needed, then you may take acetaminophen (Tylenol), naprosyn (Alleve) or ibuprofen (Advil) as needed. Take your usually prescribed medications unless otherwise directed If you need a refill on your pain medication, please contact your pharmacy.  They will contact our office to request authorization.  Prescriptions will not be filled after 5pm or on week-ends. You should eat very light the first 24 hours after surgery, such as soup, crackers, pudding, etc.  Resume your normal diet the day after surgery. Most patients will experience some swelling and bruising in the breast.  Ice packs and a good support bra will help.  Wear the breast binder provided or a sports bra for 72 hours day and night.  After that wear a sports bra during the day until you return to the office. Swelling and bruising can take several days to resolve.  It is common to experience some constipation if taking pain medication after surgery.  Increasing fluid intake and taking a stool softener will usually help or prevent this problem from occurring.  A mild laxative (Milk of Magnesia or Miralax) should be taken according to package directions if there are no bowel movements after 48 hours. I used skin glue on the incision, you may shower in 24 hours.  The glue will flake off over the next 2-3 weeks.  Any sutures or staples will be removed at the office during your follow-up visit. ACTIVITIES:  You may resume regular daily activities (gradually increasing) beginning the next day.  Wearing a good support bra or sports bra minimizes pain and swelling.  You may have  sexual intercourse when it is comfortable. You may drive when you no longer are taking prescription pain medication, you can comfortably wear a seatbelt, and you can safely maneuver your car and apply brakes. RETURN TO WORK:  ______________________________________________________________________________________ Kimberly Orr should see your doctor in the office for a follow-up appointment approximately two weeks after your surgery.  Your doctor's nurse will typically make your follow-up appointment when she calls you with your pathology report.  Expect your pathology report 3-4 business days after your surgery.  You may call to check if you do not hear from [place] after three days. OTHER INSTRUCTIONS: _______________________________________________________________________________________________ _____________________________________________________________________________________________________________________________________ _____________________________________________________________________________________________________________________________________ _____________________________________________________________________________________________________________________________________  WHEN TO CALL DR WAKEFIELD: Fever over 101.0 Nausea and/or vomiting. Extreme swelling or bruising. Continued bleeding from incision. Increased pain, redness, or drainage from the incision.  The clinic staff is available to answer your questions during regular business hours.  Please don't hesitate to call and ask to speak to one of the nurses for clinical concerns.  If you have a medical emergency, go to the nearest emergency room or call 911.  A surgeon from [facility] is always on call at the hospital.  For further questions, please visit [email or web address]    Post Anesthesia Home Care Instructions  Activity: Get plenty of rest for the remainder of the day. A responsible individual must stay  with you for 24 hours following the procedure.  For the next 24 hours, DO NOT: -Drive a car -Advertising copywriter -Drink alcoholic beverages -Take any medication unless instructed  by your physician -Make any legal decisions or sign important papers.  Meals: Start with liquid foods such as gelatin or soup. Progress to regular foods as tolerated. Avoid greasy, spicy, heavy foods. If nausea and/or vomiting occur, drink only clear liquids until the nausea and/or vomiting subsides. Call your physician if vomiting continues.  Special Instructions/Symptoms: Your throat may feel dry or sore from the anesthesia or the breathing tube placed in your throat during surgery. If this causes discomfort, gargle with warm salt water. The discomfort should disappear within 24 hours.  If you had a scopolamine patch placed behind your ear for the management of post- operative nausea and/or vomiting:  1. The medication in the patch is effective for 72 hours, after which it should be removed.  Wrap patch in a tissue and discard in the trash. Wash hands thoroughly with soap and water. 2. You may remove the patch earlier than 72 hours if you experience unpleasant side effects which may include dry mouth, dizziness or visual disturbances. 3. Avoid touching the patch. Wash your hands with soap and water after contact with the patch.   No  tylenol or ibuprofen until after 6:30PM

## 2023-03-13 NOTE — Op Note (Signed)
Preoperative diagnosis: Left breast mass with discordant biopsy Postoperative diagnosis: Same as above Procedure: Left breast radioactive seed guided excisional biopsy Surgeon: Dr. Harden Mo Anesthesia: General Estimated blood loss: Minimal Complications: None Drains: None Specimens: Left breast tissue containing seed and clip marked with paint sent to pathology Sponge needle count was correct at completion Disposition to recovery stable condition   Indications: 37 yof undergoing short term follow up for a right breast mass. This is a benign cluster of cysts and has remained stable however there is a new 1.1 cm mass in the left breast. This has undergone a biopsy and this shows benign tissue with PASH. This is discordant and we discussed seed guided excisional biopsy.     Procedure: After informed consent was obtained she was taken to the operating room.  She was given antibiotics.  SCDs were placed.  She was placed under general anesthesia without complication.  She was prepped and draped in the standard sterile surgical fashion.  A surgical timeout was then performed.   The seed was in the UIQ. I made a periareolar incision in order to hide the scar later.  I infiltrated Marcaine throughout this area.  I then used the neoprobe to dissect to the seed.   I removed the seed and some of the surrounding tissue.  Mammogram confirmed removal of the seed and the clip.  I then obtained hemostasis.  I closed the breast tissue with 2-0 Vicryl.  The skin was closed with 3-0 Vicryl and 5-0 Monocryl.  Glue and Steri-Strips were applied.  She tolerated well was extubated transferred to recovery stable.

## 2023-03-13 NOTE — Anesthesia Postprocedure Evaluation (Signed)
Anesthesia Post Note  Patient: Kimberly Orr  Procedure(s) Performed: RADIOACTIVE SEED GUIDED EXCISIONAL LEFT BREAST BIOPSY (Left: Breast)     Patient location during evaluation: PACU Anesthesia Type: General Level of consciousness: awake Pain management: pain level controlled Vital Signs Assessment: post-procedure vital signs reviewed and stable Respiratory status: spontaneous breathing, nonlabored ventilation and respiratory function stable Cardiovascular status: blood pressure returned to baseline and stable Postop Assessment: no apparent nausea or vomiting Anesthetic complications: no   No notable events documented.  Last Vitals:  Vitals:   03/13/23 1427 03/13/23 1440  BP: 102/69 106/74  Pulse: 70 65  Resp: 18 18  Temp:  36.6 C  SpO2: 97% 99%    Last Pain:  Vitals:   03/13/23 1427  TempSrc:   PainSc: 0-No pain                 Shonica Weier P Ashlin Hidalgo

## 2023-03-13 NOTE — Transfer of Care (Signed)
Immediate Anesthesia Transfer of Care Note  Patient: Kimberly Orr  Procedure(s) Performed: RADIOACTIVE SEED GUIDED EXCISIONAL LEFT BREAST BIOPSY (Left: Breast)  Patient Location: PACU  Anesthesia Type:General  Level of Consciousness: awake, alert , and oriented  Airway & Oxygen Therapy: Patient Spontanous Breathing and Patient connected to face mask oxygen  Post-op Assessment: Report given to RN and Post -op Vital signs reviewed and stable  Post vital signs: Reviewed and stable  Last Vitals:  Vitals Value Taken Time  BP 96/68 03/13/23 1406  Temp    Pulse 85 03/13/23 1408  Resp 17 03/13/23 1408  SpO2 99 % 03/13/23 1408  Vitals shown include unvalidated device data.  Last Pain:  Vitals:   03/13/23 1219  TempSrc: Oral  PainSc: 0-No pain      Patients Stated Pain Goal: 4 (03/13/23 1219)  Complications: No notable events documented.

## 2023-03-13 NOTE — Anesthesia Preprocedure Evaluation (Addendum)
Anesthesia Evaluation  Patient identified by MRN, date of birth, ID band Patient awake    Reviewed: Allergy & Precautions, NPO status , Patient's Chart, lab work & pertinent test results  Airway Mallampati: II  TM Distance: >3 FB Neck ROM: Full    Dental no notable dental hx.    Pulmonary neg pulmonary ROS   Pulmonary exam normal        Cardiovascular negative cardio ROS Normal cardiovascular exam     Neuro/Psych  PSYCHIATRIC DISORDERS  Depression    negative neurological ROS     GI/Hepatic negative GI ROS, Neg liver ROS,,,  Endo/Other  negative endocrine ROS    Renal/GU negative Renal ROS     Musculoskeletal negative musculoskeletal ROS (+)    Abdominal  (+) + obese  Peds  Hematology negative hematology ROS (+)   Anesthesia Other Findings LEFT BREAST MASS  Reproductive/Obstetrics Hcg negative                             Anesthesia Physical Anesthesia Plan  ASA: 2  Anesthesia Plan: General   Post-op Pain Management:    Induction: Intravenous  PONV Risk Score and Plan: 3 and Ondansetron, Dexamethasone, Midazolam and Treatment may vary due to age or medical condition  Airway Management Planned: LMA  Additional Equipment:   Intra-op Plan:   Post-operative Plan: Extubation in OR  Informed Consent: I have reviewed the patients History and Physical, chart, labs and discussed the procedure including the risks, benefits and alternatives for the proposed anesthesia with the patient or authorized representative who has indicated his/her understanding and acceptance.     Dental advisory given  Plan Discussed with: CRNA  Anesthesia Plan Comments:        Anesthesia Quick Evaluation

## 2023-03-13 NOTE — Interval H&P Note (Signed)
History and Physical Interval Note:  03/13/2023 1:01 PM  Kimberly Orr  has presented today for surgery, with the diagnosis of LEFT BREAST MASS.  The various methods of treatment have been discussed with the patient and family. After consideration of risks, benefits and other options for treatment, the patient has consented to  Procedure(s): RADIOACTIVE SEED GUIDED EXCISIONAL LEFT BREAST BIOPSY (Left) as a surgical intervention.  The patient's history has been reviewed, patient examined, no change in status, stable for surgery.  I have reviewed the patient's chart and labs.  Questions were answered to the patient's satisfaction.     Emelia Loron

## 2023-03-14 ENCOUNTER — Encounter (HOSPITAL_BASED_OUTPATIENT_CLINIC_OR_DEPARTMENT_OTHER): Payer: Self-pay | Admitting: General Surgery

## 2023-03-20 LAB — SURGICAL PATHOLOGY

## 2023-07-10 ENCOUNTER — Other Ambulatory Visit (HOSPITAL_BASED_OUTPATIENT_CLINIC_OR_DEPARTMENT_OTHER): Payer: Self-pay

## 2023-07-10 ENCOUNTER — Other Ambulatory Visit: Payer: Self-pay | Admitting: *Deleted

## 2023-07-10 MED ORDER — FLUCONAZOLE 150 MG PO TABS
150.0000 mg | ORAL_TABLET | Freq: Once | ORAL | 0 refills | Status: AC
  Filled 2023-07-10: qty 1, 1d supply, fill #0

## 2023-07-10 NOTE — Progress Notes (Signed)
Pt ask for a RX for Diflucan due to vaginal itching with D/C.  RX sent to [facility]

## 2023-07-16 ENCOUNTER — Other Ambulatory Visit (HOSPITAL_BASED_OUTPATIENT_CLINIC_OR_DEPARTMENT_OTHER): Payer: Self-pay

## 2023-09-02 ENCOUNTER — Encounter: Payer: Self-pay | Admitting: Family Medicine

## 2023-09-02 ENCOUNTER — Ambulatory Visit (INDEPENDENT_AMBULATORY_CARE_PROVIDER_SITE_OTHER): Payer: 59 | Admitting: Family Medicine

## 2023-09-02 VITALS — BP 110/68 | HR 82 | Temp 98.0°F | Resp 16 | Ht 65.0 in | Wt 211.0 lb

## 2023-09-02 DIAGNOSIS — Z0000 Encounter for general adult medical examination without abnormal findings: Secondary | ICD-10-CM | POA: Diagnosis not present

## 2023-09-02 DIAGNOSIS — R011 Cardiac murmur, unspecified: Secondary | ICD-10-CM | POA: Diagnosis not present

## 2023-09-02 NOTE — Patient Instructions (Addendum)
Give Kimberly Orr 2-3 business days to get the results of your labs back.   Keep the diet clean and stay active.  Please get me a copy of your advanced directive form at your convenience.   Someone will reach out in the next few days to schedule your echo.   Let Kimberly Orr know if you need anything.

## 2023-09-02 NOTE — Progress Notes (Signed)
Chief Complaint  Patient presents with   Annual Exam    Annual Exam     Well Woman LAYAN DERFLINGER is here for a complete physical.   Her last physical was >1 year ago.  Current diet: in general, a "healthy" diet. Current exercise: none. Has gained weight.  Fatigue out of ordinary? No Seatbelt? Yes Advanced directive? Yes  Health Maintenance Pap/HPV- Yes Tetanus- Yes HIV screening- Yes Hep C screening- Yes  Past Medical History:  Diagnosis Date   Anemia    Basal cell carcinoma    Depression    GERD (gastroesophageal reflux disease)    History of chicken pox    Joint pain      Past Surgical History:  Procedure Laterality Date   BREAST BIOPSY Left 01/14/2023   [place] LT BREAST BX W LOC DEV 1ST LESION IMG BX SPEC US GUIDE 01/14/2023 [facility]   BREAST BIOPSY Left 03/12/2023   [place] LT RADIOACTIVE SEED LOC 03/12/2023 [facility]   DILATION AND CURETTAGE OF UTERUS  10/22/2009   LUMBAR DISC SURGERY     RADIOACTIVE SEED GUIDED EXCISIONAL BREAST BIOPSY Left 03/13/2023   Procedure: RADIOACTIVE SEED GUIDED EXCISIONAL LEFT BREAST BIOPSY;  Surgeon: Emelia Loron, MD;  Location: [facility];  Service: General;  Laterality: Left;   WISDOM TOOTH EXTRACTION      Medications  Current Outpatient Medications on File Prior to Visit  Medication Sig Dispense Refill   alclomethasone (ACLOVATE) 0.05 % ointment Apply 1 Application topically 2 (two) times daily. 30 g 1   loratadine (CLARITIN) 10 MG tablet Take 10 mg by mouth daily.     Multiple Vitamins-Minerals (WOMENS MULTIVITAMIN PO)       Allergies No Known Allergies  Review of Systems: Constitutional:  no unexpected weight changes Eye:  no recent significant change in vision Ear/Nose/Mouth/Throat:  Ears:  no tinnitus or vertigo and no recent change in hearing Nose/Mouth/Throat:  no complaints of nasal congestion, no sore throat Cardiovascular: no chest pain Respiratory:  no cough and no shortness of  breath Gastrointestinal:  no abdominal pain, no change in bowel habits GU:  Female: negative for dysuria or pelvic pain Musculoskeletal/Extremities:  no pain of the joints Integumentary (Skin/Breast):  no abnormal skin lesions reported Neurologic:  no headaches Endocrine:  denies fatigue Hematologic/Lymphatic:  No areas of easy bleeding  Exam BP 110/68 (BP Location: Left Arm, Patient Position: Sitting, Cuff Size: Normal)   Pulse 82   Temp 98 F (36.7 C) (Oral)   Resp 16   Ht 5\' 5"  (1.651 m)   Wt 211 lb (95.7 kg)   SpO2 98%   BMI 35.11 kg/m  General:  well developed, well nourished, in no apparent distress Skin:  no significant moles, warts, or growths Head:  no masses, lesions, or tenderness Eyes:  pupils equal and round, sclera anicteric without injection Ears:  canals without lesions, TMs shiny without retraction, no obvious effusion, no erythema Nose:  nares patent, mucosa normal, and no drainage  Throat/Pharynx:  lips and gingiva without lesion; tongue and uvula midline; non-inflamed pharynx; no exudates or postnasal drainage Neck: neck supple without adenopathy, thyromegaly, or masses Lungs:  clear to auscultation, breath sounds equal bilaterally, no respiratory distress Cardio:  regular rate and rhythm, 2+ SEM heard loudest at aortic listening post, no bruits, no LE edema Abdomen:  abdomen soft, nontender; bowel sounds normal; no masses or organomegaly Genital: Defer to GYN Musculoskeletal:  symmetrical muscle groups noted without atrophy or deformity Extremities:  no clubbing, cyanosis, or edema, no deformities, no skin discoloration Neuro:  gait normal; deep tendon reflexes normal and symmetric Psych: well oriented with normal range of affect and appropriate judgment/insight  Assessment and Plan  Well adult exam - Plan: CBC, Comprehensive metabolic panel, Lipid panel  Heart murmur - Plan: ECHOCARDIOGRAM COMPLETE   Well 37 y.o. female. Counseled on diet and  exercise. Advanced directive form provided today.  Ck Echo for heart murmur which I have not heard before.  Other orders as above. Follow up in 1 yr. The patient voiced understanding and agreement to the plan.  Jilda Roche [place], DO 09/02/23 2:14 PM

## 2023-09-03 LAB — CBC
HCT: 39.1 % (ref 36.0–46.0)
Hemoglobin: 12.9 g/dL (ref 12.0–15.0)
MCHC: 33 g/dL (ref 30.0–36.0)
MCV: 82 fL (ref 78.0–100.0)
Platelets: 371 10*3/uL (ref 150.0–400.0)
RBC: 4.77 Mil/uL (ref 3.87–5.11)
RDW: 13.7 % (ref 11.5–15.5)
WBC: 9.4 10*3/uL (ref 4.0–10.5)

## 2023-09-03 LAB — COMPREHENSIVE METABOLIC PANEL
ALT: 15 U/L (ref 0–35)
AST: 16 U/L (ref 0–37)
Albumin: 4.3 g/dL (ref 3.5–5.2)
Alkaline Phosphatase: 69 U/L (ref 39–117)
BUN: 10 mg/dL (ref 6–23)
CO2: 24 meq/L (ref 19–32)
Calcium: 9.3 mg/dL (ref 8.4–10.5)
Chloride: 106 meq/L (ref 96–112)
Creatinine, Ser: 0.77 mg/dL (ref 0.40–1.20)
GFR: 96.99 mL/min (ref >60.00)
Glucose, Bld: 91 mg/dL (ref 70–99)
Potassium: 4 meq/L (ref 3.5–5.1)
Sodium: 140 meq/L (ref 135–145)
Total Bilirubin: 0.4 mg/dL (ref 0.2–1.2)
Total Protein: 6.4 g/dL (ref 6.0–8.3)

## 2023-09-03 LAB — LIPID PANEL
Cholesterol: 153 mg/dL (ref 0–200)
HDL: 47.6 mg/dL (ref >39.00)
LDL Cholesterol: 74 mg/dL (ref 0–99)
NonHDL: 105.08
Total CHOL/HDL Ratio: 3
Triglycerides: 154 mg/dL — ABNORMAL HIGH (ref 0.0–149.0)
VLDL: 30.8 mg/dL (ref 0.0–40.0)

## 2023-09-09 ENCOUNTER — Telehealth: Payer: 59 | Admitting: Physician Assistant

## 2023-09-09 ENCOUNTER — Other Ambulatory Visit (HOSPITAL_BASED_OUTPATIENT_CLINIC_OR_DEPARTMENT_OTHER): Payer: Self-pay

## 2023-09-09 DIAGNOSIS — H1032 Unspecified acute conjunctivitis, left eye: Secondary | ICD-10-CM

## 2023-09-09 DIAGNOSIS — L03213 Periorbital cellulitis: Secondary | ICD-10-CM | POA: Diagnosis not present

## 2023-09-09 MED ORDER — AMOXICILLIN-POT CLAVULANATE 875-125 MG PO TABS
1.0000 | ORAL_TABLET | Freq: Two times a day (BID) | ORAL | 0 refills | Status: DC
Start: 2023-09-09 — End: 2023-12-18
  Filled 2023-09-09: qty 14, 7d supply, fill #0

## 2023-09-09 MED ORDER — MOXIFLOXACIN HCL 0.5 % OP SOLN
1.0000 [drp] | Freq: Three times a day (TID) | OPHTHALMIC | 0 refills | Status: DC
Start: 2023-09-09 — End: 2023-12-18
  Filled 2023-09-09: qty 3, 5d supply, fill #0

## 2023-09-09 NOTE — Progress Notes (Signed)
Virtual Visit Consent   Kimberly Orr, you are scheduled for a virtual visit with a [facility] provider today. Just as with appointments in the office, your consent must be obtained to participate. Your consent will be active for this visit and any virtual visit you may have with one of our providers in the next 365 days. If you have a MyChart account, a copy of this consent can be sent to you electronically.  As this is a virtual visit, video technology does not allow for your provider to perform a traditional examination. This may limit your provider's ability to fully assess your condition. If your provider identifies any concerns that need to be evaluated in person or the need to arrange testing (such as labs, EKG, etc.), we will make arrangements to do so. Although advances in technology are sophisticated, we cannot ensure that it will always work on either your end or our end. If the connection with a video visit is poor, the visit may have to be switched to a telephone visit. With either a video or telephone visit, we are not always able to ensure that we have a secure connection.  By engaging in this virtual visit, you consent to the provision of healthcare and authorize for your insurance to be billed (if applicable) for the services provided during this visit. Depending on your insurance coverage, you may receive a charge related to this service.  I need to obtain your verbal consent now. Are you willing to proceed with your visit today? Kimberly Orr has provided verbal consent on 09/09/2023 for a virtual visit (video or telephone). Margaretann Loveless, PA-C  Date: 09/09/2023 12:32 PM  Virtual Visit via Video Note   I, Margaretann Loveless, connected with  Kimberly Orr  ([id number], 10/24/1985) on 09/09/23 at 12:30 PM EST by a video-enabled telemedicine application and verified that I am speaking with the correct person using two identifiers.  Location: Patient: Virtual Visit  Location Patient: Other: work; isolated Provider: Engineer, mining Provider: Home Office   I discussed the limitations of evaluation and management by telemedicine and the availability of in person appointments. The patient expressed understanding and agreed to proceed.    History of Present Illness: Kimberly Orr is a 37 y.o. who identifies as a female who was assigned female at birth, and is being seen today for eye redness, drainage and swelling.  HPI: Eye Problem  The left eye is affected. This is a new problem. The problem occurs constantly. The problem has been gradually worsening. There was no injury mechanism. The pain is mild. There is No known exposure to pink eye. She Does not wear contacts. Associated symptoms include blurred vision, an eye discharge, eye redness and itching. Pertinent negatives include no double vision, fever, foreign body sensation or photophobia. Treatments tried: benadryl, visine eye wash. The treatment provided no relief.   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 She does have mild dog allergy and felt may have been from that, but symptoms have not improved as they normally would with using antihistamines.  Problems:  Patient Active Problem List   Diagnosis Date Noted   Insulin resistance 08/31/2020   At risk for impaired metabolic function 08/31/2020   Obesity (BMI 30-39.9) 04/05/2020    Allergies: No Known Allergies Medications:  Current Outpatient Medications:    alclomethasone (ACLOVATE) 0.05 % ointment, Apply 1 Application topically 2 (two) times daily., Disp: 30  g, Rfl: 1   amoxicillin-clavulanate (AUGMENTIN) 875-125 MG tablet, Take 1 tablet by mouth 2 (two) times daily., Disp: 14 tablet, Rfl: 0   loratadine (CLARITIN) 10 MG tablet, Take 10 mg by mouth daily., Disp: , Rfl:    moxifloxacin (VIGAMOX) 0.5 % ophthalmic solution, Place 1 drop into the left eye 3 (three) times daily. For 5 days, Disp: 3 mL, Rfl:  0   Multiple Vitamins-Minerals (WOMENS MULTIVITAMIN PO), , Disp: , Rfl:   Observations/Objective: Patient is well-developed, well-nourished in no acute distress.  Resting comfortably  Head is normocephalic, atraumatic.  No labored breathing.  Speech is clear and coherent with logical content.  Patient is alert and oriented at baseline.  Left eye is injected and swelling noted under   Assessment and Plan: 1. Preseptal cellulitis of left lower eyelid - moxifloxacin (VIGAMOX) 0.5 % ophthalmic solution; Place 1 drop into the left eye 3 (three) times daily. For 5 days  Dispense: 3 mL; Refill: 0 - amoxicillin-clavulanate (AUGMENTIN) 875-125 MG tablet; Take 1 tablet by mouth 2 (two) times daily.  Dispense: 14 tablet; Refill: 0  2. Acute bacterial conjunctivitis of left eye - moxifloxacin (VIGAMOX) 0.5 % ophthalmic solution; Place 1 drop into the left eye 3 (three) times daily. For 5 days  Dispense: 3 mL; Refill: 0 - amoxicillin-clavulanate (AUGMENTIN) 875-125 MG tablet; Take 1 tablet by mouth 2 (two) times daily.  Dispense: 14 tablet; Refill: 0  - Suspect bacterial conjunctivitis with possible early preseptal cellulitis in the left lower lid - Vigamox and Augmentin prescribed - Warm compresses - Good hand hygiene - Seek in person evaluation if symptoms worsen or fail to improve   Follow Up Instructions: I discussed the assessment and treatment plan with the patient. The patient was provided an opportunity to ask questions and all were answered. The patient agreed with the plan and demonstrated an understanding of the instructions.  A copy of instructions were sent to the patient via MyChart unless otherwise noted below.    The patient was advised to call back or seek an in-person evaluation if the symptoms worsen or if the condition fails to improve as anticipated.    Margaretann Loveless, PA-C

## 2023-09-09 NOTE — Patient Instructions (Signed)
Kimberly Orr, thank you for joining Margaretann Loveless, PA-C for today's virtual visit.  While this provider is not your primary care provider (PCP), if your PCP is located in our provider database this encounter information will be shared with them immediately following your visit.   A [facility] MyChart account gives you access to today's visit and all your visits, tests, and labs performed at [facility] " click here if you don't have a [facility] MyChart account or go to [email or web address]  Consent: (Patient) Kimberly Orr provided verbal consent for this virtual visit at the beginning of the encounter.  Current Medications:  Current Outpatient Medications:    amoxicillin-clavulanate (AUGMENTIN) 875-125 MG tablet, Take 1 tablet by mouth 2 (two) times daily., Disp: 14 tablet, Rfl: 0   moxifloxacin (VIGAMOX) 0.5 % ophthalmic solution, Place 1 drop into the left eye 3 (three) times daily. For 5 days, Disp: 3 mL, Rfl: 0   alclomethasone (ACLOVATE) 0.05 % ointment, Apply 1 Application topically 2 (two) times daily., Disp: 30 g, Rfl: 1   loratadine (CLARITIN) 10 MG tablet, Take 10 mg by mouth daily., Disp: , Rfl:    Multiple Vitamins-Minerals (WOMENS MULTIVITAMIN PO), , Disp: , Rfl:    Medications ordered in this encounter:  Meds ordered this encounter  Medications   moxifloxacin (VIGAMOX) 0.5 % ophthalmic solution    Sig: Place 1 drop into the left eye 3 (three) times daily. For 5 days    Dispense:  3 mL    Refill:  0    Order Specific Question:   Supervising Provider    Answer:   Merrilee Jansky [[id number]]   amoxicillin-clavulanate (AUGMENTIN) 875-125 MG tablet    Sig: Take 1 tablet by mouth 2 (two) times daily.    Dispense:  14 tablet    Refill:  0    Order Specific Question:   Supervising Provider    Answer:   Merrilee Jansky [id number]     *If you need refills on other medications prior to your next appointment, please contact your  pharmacy*  Follow-Up: Call back or seek an in-person evaluation if the symptoms worsen or if the condition fails to improve as anticipated.  [facility] [phone]  Other Instructions  Bacterial Conjunctivitis, Adult Bacterial conjunctivitis is an infection of the clear membrane that covers the white part of the eye and the inner surface of the eyelid (conjunctiva). When the blood vessels in the conjunctiva become inflamed, the eye becomes red or pink. The eye often feels irritated or itchy. Bacterial conjunctivitis spreads easily from person to person (is contagious). It also spreads easily from one eye to the other eye. What are the causes? This condition is caused by bacteria. You may get the infection if you come into close contact with: A person who is infected with the bacteria. Items that are contaminated with the bacteria, such as a face towel, contact lens solution, or eye makeup. What increases the risk? You are more likely to develop this condition if: You are exposed to other people who have the infection. You wear contact lenses. You have a sinus infection. You have had a recent eye injury or surgery. You have a weak body defense system (immune system). You have a medical condition that causes dry eyes. What are the signs or symptoms? Symptoms of this condition include: Thick, yellowish discharge from the eye. This may turn into a crust on the eyelid overnight and cause your eyelids  to stick together. Tearing or watery eyes. Itchy eyes. Burning feeling in your eyes. Eye redness. Swollen eyelids. Blurred vision. How is this diagnosed? This condition is diagnosed based on your symptoms and medical history. Your health care provider may also take a sample of discharge from your eye to find the cause of your infection. How is this treated? This condition may be treated with: Antibiotic eye drops or ointment to clear the infection more quickly and prevent  the spread of infection to others. Antibiotic medicines taken by mouth (orally) to treat infections that do not respond to drops or ointments or that last longer than 10 days. Cool, wet cloths (cool compresses) placed on the eyes. Artificial tears applied 2-6 times a day. Follow these instructions at home: Medicines Take or apply your antibiotic medicine as told by your health care provider. Do not stop using the antibiotic, even if your condition improves, unless directed by your health care provider. Take or apply over-the-counter and prescription medicines only as told by your health care provider. Be very careful to avoid touching the edge of your eyelid with the eye-drop bottle or the ointment tube when you apply medicines to the affected eye. This will keep you from spreading the infection to your other eye or to other people. Managing discomfort Gently wipe away any drainage from your eye with a warm, wet washcloth or a cotton ball. Apply a clean, cool compress to your eye for 10-20 minutes, 3-4 times a day. General instructions Do not wear contact lenses until the inflammation is gone and your health care provider says it is safe to wear them again. Ask your health care provider how to sterilize or replace your contact lenses before you use them again. Wear glasses until you can resume wearing contact lenses. Avoid wearing eye makeup until the inflammation is gone. Throw away any old eye cosmetics that may be contaminated. Change or wash your pillowcase every day. Do not share towels or washcloths. This may spread the infection. Wash your hands often with soap and water for at least 20 seconds and especially before touching your face or eyes. Use paper towels to dry your hands. Avoid touching or rubbing your eyes. Do not drive or use heavy machinery if your vision is blurred. Contact a health care provider if: You have a fever. Your symptoms do not get better after 10 days. Get help  right away if: You have a fever and your symptoms suddenly get worse. You have severe pain when you move your eye. You have facial pain, redness, or swelling. You have a sudden loss of vision. Summary Bacterial conjunctivitis is an infection of the clear membrane that covers the white part of the eye and the inner surface of the eyelid (conjunctiva). Bacterial conjunctivitis spreads easily from eye to eye and from person to person (is contagious). Wash your hands often with soap and water for at least 20 seconds and especially before touching your face or eyes. Use paper towels to dry your hands. Take or apply your antibiotic medicine as told by your health care provider. Do not stop using the antibiotic even if your condition improves. Contact a health care provider if you have a fever or if your symptoms do not get better after 10 days. Get help right away if you have a sudden loss of vision. This information is not intended to replace advice given to you by your health care provider. Make sure you discuss any questions you have with your  health care provider. Document Revised: 01/18/2021 Document Reviewed: 01/18/2021 Elsevier Patient Education  2024 Elsevier Inc.   Preseptal Cellulitis, Adult Preseptal cellulitis is an infection of the eyelid and the tissues around the eye (periorbital area). The infection causes painful swelling and redness. This condition may also be called periorbital cellulitis. In most cases, the condition can be treated with antibiotic medicine at home. It is important to treat preseptal cellulitis right away so that it does not get worse. If it gets worse, it can spread to the eye socket and eye muscles (orbital cellulitis). Orbital cellulitis is a medical emergency. What are the causes? Preseptal cellulitis is most commonly caused by bacteria. In rare cases, it can be caused by a virus or fungus. The germs that cause preseptal cellulitis may come from: A sinus  infection that spreads near the eyes. An injury near the eye, such as a scratch, puncture wound, animal bite, or insect bite. A skin rash, such as eczema or poison ivy, that becomes infected. An infected pimple on the eyelid (stye). Infection after eyelid surgery or injury. What increases the risk? You are more likely to develop this condition if: You have a weakened disease-fighting system (immune system). You have a medical condition that raises your risk for sinus infections, such as nasal polyps. What are the signs or symptoms? Symptoms of this condition include: Eyelids that are red and swollen and feel unusually hot. Fever. Difficulty opening the eye. Headache. Pain in the face. Symptoms of this condition usually develop suddenly. How is this diagnosed? This condition may be diagnosed based on your symptoms, your medical history, and an eye exam. You may also have tests, such as: Blood tests. Tests (cultures) to find out which specific bacteria are causing the infection. You may have a culture of any open wound or drainage. CT scan. MRI. This is less common. How is this treated? This condition is treated with antibiotic medicines. These may be given by mouth (orally), through an IV, or as an injection. In rare cases, you may need surgery to drain an infected area. Follow these instructions at home: Medicines Take your antibiotic medicine as told by your health care provider. Do not stop taking the antibiotic even if you start to feel better Take over-the-counter and prescription medicines only as told by your health care provider. Eye Care Do not use eye drops without first getting approval from your health care provider. Do not touch or rub your eye. If you wear contact lenses, do not wear them until your health care provider approves. Keep the eye area clean and dry. Wash the eye area with a clean washcloth, warm water, and baby shampoo or mild soap. To help relieve  discomfort, place a clean washcloth that is wet with warm water over your eye. Leave the washcloth on for a few minutes, then remove it. General instructions Wash your hands with soap and water often for at least 20 seconds. If soap and water are not available, use hand sanitizer. Do not use any products that contain nicotine or tobacco, such as cigarettes, e-cigarettes, and chewing tobacco. If you need help quitting, ask your health care provider. Drink enough fluid to keep your urine pale yellow. Do not drive or operate machinery until your health care provider says that it is safe. Ask your health care provider if it is safe for you to drive. Stay up to date on your vaccinations. Keep all follow-up visits. This includes any visits with an eye specialist (ophthalmologist)  or dentist. This is important. Get help right away if: You have new symptoms. Your symptoms get worse or do not get better with treatment. You have a fever. Your vision becomes blurry or gets worse in any way. Your eye looks like it is sticking out or bulging out (proptosis). You develop double vision. You have trouble moving your eyes or pain when moving your eyes You have a severe headache. You have neck stiffness or severe neck pain. These symptoms may represent a serious problem that is an emergency. Do not wait to see if the symptoms will go away. Get medical help right away. Call your local emergency services (911 in the [place] Do not drive yourself to the hospital. Summary Preseptal cellulitis is an infection of the eyelid and the tissues around the eye. Symptoms of preseptal cellulitis usually develop suddenly and include red and swollen eyelids, fever, difficulty opening the eye, headache, and facial pain. This condition is treated with antibiotic medicines. Do not stop taking the antibiotic even if you start to feel better. Preseptal cellulitis can develop into orbital cellulitis, which is a medical emergency. If  your condition does not improve or worsens, visit your heath care provider right away. This information is not intended to replace advice given to you by your health care provider. Make sure you discuss any questions you have with your health care provider. Document Revised: 02/09/2020 Document Reviewed: 02/10/2020 Elsevier Patient Education  2024 Elsevier Inc.    If you have been instructed to have an in-person evaluation today at a local Urgent Care facility, please use the link below. It will take you to a list of all of our available [facility], including address, phone number and hours of operation. Please do not delay care.  [facility]  If you or a family member do not have a primary care provider, use the link below to schedule a visit and establish care. When you choose a [facility] primary care physician or advanced practice provider, you gain a long-term partner in health. Find a Primary Care Provider  Learn more about [facility]'s in-office and virtual care options: [facility] - Get Care Now

## 2023-09-12 ENCOUNTER — Ambulatory Visit (HOSPITAL_COMMUNITY)
Admission: RE | Admit: 2023-09-12 | Discharge: 2023-09-12 | Disposition: A | Discharge status: Home / Self Care | Payer: 59 | Source: Ambulatory Visit | Attending: Internal Medicine | Admitting: Internal Medicine

## 2023-09-12 DIAGNOSIS — R011 Cardiac murmur, unspecified: Secondary | ICD-10-CM | POA: Insufficient documentation

## 2023-09-12 LAB — ECHOCARDIOGRAM COMPLETE
AR max vel: 2.03 cm2
AV Area VTI: 2.08 cm2
AV Area mean vel: 1.98 cm2
AV Mean grad: 4.3 mm[Hg]
AV Peak grad: 8 mm[Hg]
Ao pk vel: 1.42 m/s
Area-P 1/2: 3.91 cm2
MV M vel: 1.06 m/s
MV Peak grad: 4.5 mm[Hg]
S' Lateral: 3.05 cm

## 2023-11-19 ENCOUNTER — Encounter: Payer: Self-pay | Admitting: Family Medicine

## 2023-12-05 ENCOUNTER — Other Ambulatory Visit: Payer: Self-pay | Admitting: Obstetrics and Gynecology

## 2023-12-05 DIAGNOSIS — Z1231 Encounter for screening mammogram for malignant neoplasm of breast: Secondary | ICD-10-CM

## 2023-12-18 ENCOUNTER — Other Ambulatory Visit (HOSPITAL_BASED_OUTPATIENT_CLINIC_OR_DEPARTMENT_OTHER): Payer: Self-pay

## 2023-12-18 ENCOUNTER — Other Ambulatory Visit: Payer: Self-pay | Admitting: Family Medicine

## 2023-12-18 MED ORDER — METFORMIN HCL ER 500 MG PO TB24
500.0000 mg | ORAL_TABLET | Freq: Every day | ORAL | 2 refills | Status: DC
  Filled 2023-12-18: qty 30, 30d supply, fill #0

## 2023-12-26 ENCOUNTER — Ambulatory Visit: Payer: 59 | Admitting: Obstetrics and Gynecology

## 2023-12-26 ENCOUNTER — Encounter: Payer: Self-pay | Admitting: Obstetrics and Gynecology

## 2023-12-26 VITALS — BP 116/82 | HR 72 | Ht 65.0 in | Wt 213.0 lb

## 2023-12-26 DIAGNOSIS — Z01419 Encounter for gynecological examination (general) (routine) without abnormal findings: Secondary | ICD-10-CM | POA: Diagnosis not present

## 2023-12-26 NOTE — Progress Notes (Signed)
 ANNUAL EXAM Patient name: Kimberly Orr MRN [id number]  Date of birth: [birth year] Chief Complaint:   Annual Exam  History of Present Illness:   Kimberly Orr is a 38 y.o. [phone] with Patient's last menstrual period was 12/21/2023. being seen today for a routine annual exam.  Current complaints:  RUQ at night RLQ pain with past two cycles, took ibuprofen for some relief. Cycles regular  Partner is s/p vasectomy  Last pap 06/20/20. Results were: NILM w/ HRHPV negative.  Last mammogram:  06/09/23 diagnostic MMG for breast lump, now s/p lumpectomy. Final path c/w fibroadenoma, focal flat epithelial atypia, PASH. Family h/o breast cancer: yes grandmother Last colonoscopy: n/a. Results were: N/A. Family h/o colorectal cancer: no HPV vaccine: no     09/02/2023    1:41 PM 12/03/2022    1:12 PM 04/23/2022    2:22 PM 04/10/2021    3:02 PM 04/20/2020    7:33 AM  Depression screen PHQ 2/9  Decreased Interest 0 0 0 0 1  Down, Depressed, Hopeless 0 0 0 0 2  PHQ - 2 Score 0 0 0 0 3  Altered sleeping  0 0  1  Tired, decreased energy  0 1  2  Change in appetite  0 0  2  Feeling bad or failure about yourself   0 0  1  Trouble concentrating  0 0  1  Moving slowly or fidgety/restless  0 0  1  Suicidal thoughts  0 0  0  PHQ-9 Score  0 1  11  Difficult doing work/chores  Not difficult at all Not difficult at all  Somewhat difficult         No data to display           Review of Systems:   Pertinent items are noted in HPI Denies any headaches, blurred vision, fatigue, shortness of breath, chest pain, abdominal pain, abnormal vaginal discharge/itching/odor/irritation, problems with periods, bowel movements, urination, or intercourse unless otherwise stated above. Pertinent History Reviewed:  Reviewed past medical,surgical, social and family history.  Reviewed problem list, medications and allergies. Physical Assessment:   Vitals:   12/26/23 0812  BP: 116/82  Pulse: 72  Weight:  213 lb (96.6 kg)  Height: 5\' 5"  (1.651 m)  Body mass index is 35.45 kg/m.        Physical Examination:   General appearance - well appearing, and in no distress  Mental status - alert, oriented to person, place, and time  Chest - respiratory effort normal  Heart - normal peripheral perfusion  Breasts - offered, declines today  Abdomen - soft, nontender, nondistended, no masses or organomegaly  Pelvic - VULVA: normal appearing vulva with no masses, tenderness or lesions  CERVIX: no CMT or mass  UTERUS: uterus is felt to be normal size, shape, consistency and nontender   ADNEXA: No adnexal masses or tenderness noted.  Chaperone present for exam  No results found for this or any previous visit (from the past 24 hours).  Assessment & Plan:  1) Well-Woman Exam Mammogram: Scheduled 01/10/24 Colonoscopy: @ 40yo, or sooner if problems Pap: Due 2026 Gardasil: declines Discussed physical activity, walking & strength training. Has done [facility] in the past but dropped off since they are building a new house for the family  Labs/procedures today:   No orders of the defined types were placed in this encounter.  Meds: No orders of the defined types were placed in this encounter.  Follow-up: Return  in about 1 year (around 12/25/2024) for annual exam or sooner prn.  Lennart Pall, MD 12/26/2023 8:30 AM

## 2024-01-08 ENCOUNTER — Ambulatory Visit: Payer: 59 | Admitting: Obstetrics and Gynecology

## 2024-01-10 ENCOUNTER — Ambulatory Visit
Admission: RE | Admit: 2024-01-10 | Discharge: 2024-01-10 | Disposition: A | Discharge status: Home / Self Care | Payer: 59 | Source: Ambulatory Visit | Attending: Obstetrics and Gynecology | Admitting: Obstetrics and Gynecology

## 2024-01-10 DIAGNOSIS — Z1231 Encounter for screening mammogram for malignant neoplasm of breast: Secondary | ICD-10-CM

## 2024-01-14 ENCOUNTER — Encounter: Payer: Self-pay | Admitting: Obstetrics and Gynecology

## 2024-02-11 ENCOUNTER — Other Ambulatory Visit (HOSPITAL_BASED_OUTPATIENT_CLINIC_OR_DEPARTMENT_OTHER): Payer: Self-pay

## 2024-02-24 ENCOUNTER — Encounter: Payer: Self-pay | Admitting: Family Medicine

## 2024-02-28 ENCOUNTER — Ambulatory Visit: Admitting: Family Medicine

## 2024-02-28 ENCOUNTER — Other Ambulatory Visit (HOSPITAL_BASED_OUTPATIENT_CLINIC_OR_DEPARTMENT_OTHER): Payer: Self-pay

## 2024-02-28 ENCOUNTER — Encounter: Payer: Self-pay | Admitting: Family Medicine

## 2024-02-28 VITALS — BP 132/80 | HR 84 | Temp 98.0°F | Resp 16 | Ht 65.0 in | Wt 215.0 lb

## 2024-02-28 DIAGNOSIS — R1011 Right upper quadrant pain: Secondary | ICD-10-CM | POA: Diagnosis not present

## 2024-02-28 MED ORDER — PANTOPRAZOLE SODIUM 40 MG PO TBEC
40.0000 mg | DELAYED_RELEASE_TABLET | Freq: Every day | ORAL | 1 refills | Status: DC
Start: 2024-02-28 — End: 2024-07-22
  Filled 2024-02-28: qty 30, 30d supply, fill #0

## 2024-02-28 NOTE — Progress Notes (Signed)
 Chief Complaint  Patient presents with   Nausea    Nausea and Abdominal pain    Subjective: Patient is a 38 y.o. female here for RUQ abd pain.  Over the past 2 mo has been having RUQ abd pain. No nausea, some vomiting. Sometimes after meals, sometimes in the AM. No fevers, bowel changes, difficulty swallowing. Sometimes fatty foods exacerbate her symptoms.  She took ranitidine without significant relief a couple months ago.  Unfortunately she has gained around 20 pounds in the last year.  Past Medical History:  Diagnosis Date   Anemia    Basal cell carcinoma    Depression    GERD (gastroesophageal reflux disease)    History of chicken pox    Joint pain     Objective: BP 132/80 (BP Location: Left Arm, Patient Position: Sitting)   Pulse 84   Temp 98 F (36.7 C) (Oral)   Resp 16   Ht 5\' 5"  (1.651 m)   Wt 215 lb (97.5 kg)   SpO2 98%   BMI 35.78 kg/m  General: Awake, appears stated age Heart: RRR, no LE edema Lungs: CTAB, no rales, wheezes or rhonchi. No accessory muscle use Mouth: MMM Abd: BS+, S, ND, TTP in RUQ region; +Murphy's; neg McBurney's, Carnett's, Rovsing's Psych: Age appropriate judgment and insight, normal affect and mood  Assessment and Plan: RUQ abdominal pain - Plan: US  ABDOMEN LIMITED RUQ (LIVER/GB), pantoprazole  (PROTONIX ) 40 MG tablet  She seems to be dealing w possibly 2 separate items. Add PPI to potential reflux s/s's. Ck RUQ US  and [facility]. Reflux precautions discussed. F/u pending above.  The patient voiced understanding and agreement to the plan.  Shellie Dials Kirby, DO 02/28/24  4:19 PM

## 2024-02-28 NOTE — Patient Instructions (Addendum)
 Someone will reach out regarding your ultrasound.   The only lifestyle changes that have data behind them are weight loss for the overweight/obese and elevating the head of the bed. Finding out which foods/positions are triggers is important.  OK to continue Zantac as needed.  Let us  know if you need anything.

## 2024-03-04 ENCOUNTER — Ambulatory Visit

## 2024-03-04 ENCOUNTER — Ambulatory Visit: Payer: Self-pay | Admitting: Family Medicine

## 2024-03-04 DIAGNOSIS — R1011 Right upper quadrant pain: Secondary | ICD-10-CM

## 2024-05-07 ENCOUNTER — Other Ambulatory Visit (HOSPITAL_BASED_OUTPATIENT_CLINIC_OR_DEPARTMENT_OTHER): Payer: Self-pay

## 2024-07-22 ENCOUNTER — Ambulatory Visit: Payer: Self-pay

## 2024-07-22 ENCOUNTER — Encounter: Payer: Self-pay | Admitting: Family Medicine

## 2024-07-22 ENCOUNTER — Ambulatory Visit: Admitting: Family Medicine

## 2024-07-22 ENCOUNTER — Other Ambulatory Visit (HOSPITAL_BASED_OUTPATIENT_CLINIC_OR_DEPARTMENT_OTHER): Payer: Self-pay

## 2024-07-22 VITALS — BP 124/78 | HR 100 | Temp 98.0°F | Resp 16 | Ht 65.0 in | Wt 209.0 lb

## 2024-07-22 DIAGNOSIS — M7989 Other specified soft tissue disorders: Secondary | ICD-10-CM | POA: Diagnosis not present

## 2024-07-22 DIAGNOSIS — R109 Unspecified abdominal pain: Secondary | ICD-10-CM | POA: Diagnosis not present

## 2024-07-22 DIAGNOSIS — R10A3 Flank pain, bilateral: Secondary | ICD-10-CM | POA: Diagnosis not present

## 2024-07-22 LAB — POC URINALSYSI DIPSTICK (AUTOMATED)
Bilirubin, UA: NEGATIVE
Glucose, UA: NEGATIVE
Ketones, UA: NEGATIVE
Leukocytes, UA: NEGATIVE
Protein, UA: NEGATIVE
Spec Grav, UA: 1.01 (ref 1.010–1.025)
Urobilinogen, UA: 0.2 U/dL
pH, UA: 5 (ref 5.0–8.0)

## 2024-07-22 MED ORDER — METHOCARBAMOL 500 MG PO TABS
500.0000 mg | ORAL_TABLET | Freq: Three times a day (TID) | ORAL | 0 refills | Status: DC | PRN
Start: 2024-07-22 — End: 2024-08-21
  Filled 2024-07-22: qty 21, 7d supply, fill #0

## 2024-07-22 MED ORDER — TRAMADOL HCL 50 MG PO TABS
50.0000 mg | ORAL_TABLET | Freq: Three times a day (TID) | ORAL | 0 refills | Status: AC | PRN
Start: 2024-07-22 — End: 2024-07-27
  Filled 2024-07-22: qty 15, 5d supply, fill #0

## 2024-07-22 NOTE — Patient Instructions (Addendum)
 Heat (pad or rice pillow in microwave) over affected area, 10-15 minutes twice daily.   Ice/cold pack over area for 10-15 min twice daily.  OK to take Tylenol  1000 mg (2 extra strength tabs) or 975 mg (3 regular strength tabs) every 6 hours as needed.  Give us  2-3 business days to get the results of your labs back.   For the swelling in your lower extremities, be sure to elevate your legs when able, mind the salt intake, stay physically active and consider wearing compression stockings.  Let us  know if you need anything.  Mid-Back Strain Rehab It is normal to feel mild stretching, pulling, tightness, or discomfort as you do these exercises, but you should stop right away if you feel sudden pain or your pain gets worse.  Stretching and range of motion exercises This exercise warms up your muscles and joints and improves the movement and flexibility of your back and shoulders. This exercise also help to relieve pain. Exercise A: Chest and spine stretch  Lie down on your back on a firm surface. Roll a towel or a small blanket so it is about 4 inches (10 cm) in diameter. Put the towel lengthwise under the middle of your back so it is under your spine, but not under your shoulder blades. To increase the stretch, you may put your hands behind your head and let your elbows fall to your sides. Hold for 30 seconds. Repeat exercise 2 times. Complete this exercise 3 times per week. Strengthening exercises These exercises build strength and endurance in your back and your shoulder blade muscles. Endurance is the ability to use your muscles for a long time, even after they get tired. Exercise C: Straight arm rows (shoulder extension) Stand with your feet shoulder width apart. Secure an exercise band to a stable object in front of you so the band is at or above shoulder height. Hold one end of the exercise band in each hand. Straighten your elbows and lift your hands up to shoulder height. Step  back, away from the secured end of the exercise band, until the band stretches. Squeeze your shoulder blades together and pull your hands down to the sides of your thighs. Stop when your hands are straight down by your sides. Do not let your hands go behind your body. Hold for 2 seconds. Slowly return to the starting position. Repeat 2 times. Complete this exercise 3 times per week. Exercise D: Shoulder external rotation, prone Lie on your abdomen on a firm bed so your left / right forearm hangs over the edge of the bed and your upper arm is on the bed, straight out from your body. Your elbow should be bent. Your palm should be facing your feet. If instructed, hold a 2-5 lb weight in your hand. Squeeze your shoulder blade toward the middle of your back. Do not let your shoulder lift toward your ear. Keep your elbow bent in an L shape (90 degrees) while you slowly move your forearm up toward the ceiling. Move your forearm up to the height of the bed, toward your head. Your upper arm should not move. At the top of the movement, your palm should face the floor. Hold for 1 second. Slowly return to the starting position and relax your muscles. Repeat 2 times. Complete this exercise 3 times per week. Exercise E: Scapular retraction and external rotation, rowing  Sit in a stable chair without armrests, or stand. Secure an exercise band to a stable object  in front of you so it is at shoulder height. Hold one end of the exercise band in each hand. Bring your arms out straight in front of you. Step back, away from the secured end of the exercise band, until the band stretches. Pull the band backward. As you do this, bend your elbows and squeeze your shoulder blades together, but avoid letting the rest of your body move. Do not let your shoulders lift up toward your ears. Stop when your elbows are at your sides or slightly behind your body. Hold for 1 second1. Slowly straighten your arms to return  to the starting position. Repeat 2 times. Complete this exercise 3 times per week. Posture and body mechanics  Body mechanics refers to the movements and positions of your body while you do your daily activities. Posture is part of body mechanics. Good posture and healthy body mechanics can help to relieve stress in your body's tissues and joints. Good posture means that your spine is in its natural S-curve position (your spine is neutral), your shoulders are pulled back slightly, and your head is not tipped forward. The following are general guidelines for applying improved posture and body mechanics to your everyday activities. Standing  When standing, keep your spine neutral and your feet about hip-width apart. Keep a slight bend in your knees. Your ears, shoulders, and hips should line up. When you do a task in which you lean forward while standing in one place for a long time, place one foot up on a stable object that is 2-4 inches (5-10 cm) high, such as a footstool. This helps keep your spine neutral. Sitting  When sitting, keep your spine neutral and keep your feet flat on the floor. Use a footrest, if necessary, and keep your thighs parallel to the floor. Avoid rounding your shoulders, and avoid tilting your head forward. When working at a desk or a computer, keep your desk at a height where your hands are slightly lower than your elbows. Slide your chair under your desk so you are close enough to maintain good posture. When working at a computer, place your monitor at a height where you are looking straight ahead and you do not have to tilt your head forward or downward to look at the screen. Resting  When lying down and resting, avoid positions that are most painful for you. If you have pain with activities such as sitting, bending, stooping, or squatting (flexion-based activities), lie in a position in which your body does not bend very much. For example, avoid curling up on your side  with your arms and knees near your chest (fetal position). If you have pain with activities such as standing for a long time or reaching with your arms (extension-based activities), lie with your spine in a neutral position and bend your knees slightly. Try the following positions: Lying on your side with a pillow between your knees. Lying on your back with a pillow under your knees.  Lifting  When lifting objects, keep your feet at least shoulder-width apart and tighten your abdominal muscles. Bend your knees and hips and keep your spine neutral. It is important to lift using the strength of your legs, not your back. Do not lock your knees straight out. Always ask for help to lift heavy or awkward objects. Make sure you discuss any questions you have with your health care provider. Document Released: 10/08/2005 Document Revised: 06/14/2016 Document Reviewed: 07/20/2015 Elsevier Interactive Patient Education  [employer].

## 2024-07-22 NOTE — Progress Notes (Signed)
 Musculoskeletal Exam  Patient: Kimberly Orr DOB: 1986/02/20  DOS: 07/22/2024  SUBJECTIVE:  Chief Complaint:   Chief Complaint  Patient presents with   Flank Pain    Flank Pain    Kimberly Orr is a 38 y.o.  female for evaluation and treatment of back pain.   Onset:  1 day ago. No inj or change in activity.  Location: lower flank b/l Character:  aching and dull; constant Progression of issue:  has worsened Associated symptoms: none No bruising, redness, swelling, fevers, nausea, vomiting, urinary complaints Denies bowel/bladder incontinence or weakness, hx of kidney stones Treatment: to date has been acetaminophen .   Neurovascular symptoms: no  Patient has had 1 month of swelling in both lower legs.  No shortness of breath or chest pain.  She is not coughing.  No history of heart failure, kidney failure, or liver failure.  She has actually lost weight over the past several months.  Past Medical History:  Diagnosis Date   Anemia    Basal cell carcinoma    Depression    GERD (gastroesophageal reflux disease)    History of chicken pox    Joint pain     Objective:  VITAL SIGNS: BP 124/78 (BP Location: Left Arm, Patient Position: Sitting)   Pulse 100   Temp 98 F (36.7 C) (Oral)   Resp 16   Ht 5' 5 (1.651 m)   Wt 209 lb (94.8 kg)   SpO2 97%   BMI 34.78 kg/m  Constitutional: Well formed, well developed. No acute distress. HENT: Normocephalic, atraumatic.  Heart: RRR, 2+ pitting bilateral lower extremity edema tapering at the proximal third of the tibia Thorax & Lungs: CTAB.  No accessory muscle use Musculoskeletal: back.   Tenderness to palpation: Yes over the lower thoracic paraspinal musculature and cephalad portion of the erector spinae muscle group bilaterally.  No bony tenderness. Deformity: no Ecchymosis: no Straight leg test: negative for Negative Lloyd's. No calf TTP; negative Homans bilaterally Neurologic: Normal sensory function. No focal  deficits noted. DTR's equal and symmetric in UE's/LE's. No clonus. Psychiatric: Normal mood. Age appropriate judgment and insight. Alert & oriented x 3.    Assessment:  Bilateral flank pain - Plan: methocarbamol (ROBAXIN) 500 MG tablet, traMADol  (ULTRAM ) 50 MG tablet, Urinalysis, Routine w reflex microscopic  Side pain - Plan: Urine Culture, POCT Urinalysis Dipstick (Automated), CANCELED: Urine Microalbumin w/creat. ratio  Localized swelling of both lower extremities - Plan: CBC, Comprehensive metabolic panel with GFR, TSH  Plan: 1. Stretches/exercises, heat, ice, Tylenol , tramadol  as needed; warnings about tramadol  verbalized and written down.  Check urine to rule out stone versus infection.  Given exam, suspect musculoskeletal etiology. 2.  Check above labs.  If negative, we will stick with conservative therapy including minding salt intake, physical activity, elevating legs, and compression stockings.  Bilateral DVTs extremely unlikely. F/u as originally scheduled or as needed. The patient voiced understanding and agreement to the plan.  I spent 30 minutes with the patient discussing the above plans in addition to reviewing her chart on the same day of the visit.  Mabel Mt [place], DO 07/22/24  4:29 PM

## 2024-07-22 NOTE — Telephone Encounter (Signed)
 FYI Only or Action Required?: FYI only for provider.  Patient was last seen in primary care on 02/28/2024 by Frann Mabel Mt, DO.  Called Nurse Triage reporting Back Pain.  Symptoms began about a month ago.  Interventions attempted: OTC medications: Roll on pain reliever and Rest, hydration, or home remedies.  Symptoms are: rapidly worsening.  Triage Disposition: See Physician Within 24 Hours  Patient/caregiver understands and will follow disposition?: Yes         Copied from CRM #[id number]. Topic: Clinical - Red Word Triage >> Jul 22, 2024  8:46 AM Roselie BROCKS wrote: [facility] that prompted transfer to Nurse Triage: Patient states she has very painful back and kidney pain, Reason for Disposition  MODERATE pain (e.g., interferes with normal activities or awakens from sleep)  Answer Assessment - Initial Assessment Questions 1. LOCATION: Where does it hurt? (e.g., left, right)     LBP bilateral into flank 2. ONSET: When did the pain start?     August, worsening, bilat flank pain worsening in the last 12 hours 3. SEVERITY: How bad is the pain? (e.g., Scale 1-10; mild, moderate, or severe)     5/10 4. PATTERN: Does the pain come and go, or is it constant?      Constant, varies in intensity 5. CAUSE: What do you think is causing the pain?     Possible kidney infection/UTI 6. OTHER SYMPTOMS:  Do you have any other symptoms? (e.g., fever, abdomen pain, vomiting, leg weakness, burning with urination, blood in urine)     None 7. PREGNANCY:  Is there any chance you are pregnant? When was your last menstrual period?     None  Protocols used: Flank Pain-A-AH

## 2024-07-23 ENCOUNTER — Other Ambulatory Visit: Payer: Self-pay | Admitting: Family Medicine

## 2024-07-23 ENCOUNTER — Ambulatory Visit: Payer: Self-pay | Admitting: Family Medicine

## 2024-07-23 DIAGNOSIS — R10A3 Flank pain, bilateral: Secondary | ICD-10-CM

## 2024-07-23 LAB — COMPREHENSIVE METABOLIC PANEL WITH GFR
ALT: 11 U/L (ref 0–35)
AST: 13 U/L (ref 0–37)
Albumin: 4.6 g/dL (ref 3.5–5.2)
Alkaline Phosphatase: 76 U/L (ref 39–117)
BUN: 9 mg/dL (ref 6–23)
CO2: 26 meq/L (ref 19–32)
Calcium: 9.5 mg/dL (ref 8.4–10.5)
Chloride: 102 meq/L (ref 96–112)
Creatinine, Ser: 1 mg/dL (ref 0.40–1.20)
GFR: 70.44 mL/min (ref >60.00)
Glucose, Bld: 78 mg/dL (ref 70–99)
Potassium: 3.6 meq/L (ref 3.5–5.1)
Sodium: 140 meq/L (ref 135–145)
Total Bilirubin: 0.3 mg/dL (ref 0.2–1.2)
Total Protein: 7.4 g/dL (ref 6.0–8.3)

## 2024-07-23 LAB — CBC
HCT: 40.6 % (ref 36.0–46.0)
Hemoglobin: 13 g/dL (ref 12.0–15.0)
MCHC: 32 g/dL (ref 30.0–36.0)
MCV: 78 fl (ref 78.0–100.0)
Platelets: 376 K/uL (ref 150.0–400.0)
RBC: 5.2 Mil/uL — ABNORMAL HIGH (ref 3.87–5.11)
RDW: 14.2 % (ref 11.5–15.5)
WBC: 11.8 K/uL — ABNORMAL HIGH (ref 4.0–10.5)

## 2024-07-23 LAB — URINALYSIS, ROUTINE W REFLEX MICROSCOPIC
Bilirubin Urine: NEGATIVE
Ketones, ur: NEGATIVE
Leukocytes,Ua: NEGATIVE
Nitrite: NEGATIVE
Specific Gravity, Urine: 1.025 (ref 1.000–1.030)
Total Protein, Urine: NEGATIVE
Urine Glucose: NEGATIVE
Urobilinogen, UA: 0.2 (ref 0.0–1.0)
WBC, UA: NONE SEEN (ref 0–?)
pH: 6 (ref 5.0–8.0)

## 2024-07-23 LAB — URINE CULTURE
MICRO NUMBER:: 17042251
SPECIMEN QUALITY:: ADEQUATE

## 2024-07-23 LAB — TSH: TSH: 1.39 u[IU]/mL (ref 0.35–5.50)

## 2024-07-24 ENCOUNTER — Ambulatory Visit: Payer: Self-pay | Admitting: Family Medicine

## 2024-07-24 ENCOUNTER — Other Ambulatory Visit: Payer: Self-pay | Admitting: Family Medicine

## 2024-07-24 ENCOUNTER — Ambulatory Visit (HOSPITAL_BASED_OUTPATIENT_CLINIC_OR_DEPARTMENT_OTHER)
Admission: RE | Admit: 2024-07-24 | Discharge: 2024-07-24 | Disposition: A | Discharge status: Home / Self Care | Source: Ambulatory Visit | Attending: Family Medicine | Admitting: Family Medicine

## 2024-07-24 DIAGNOSIS — R10A3 Flank pain, bilateral: Secondary | ICD-10-CM | POA: Insufficient documentation

## 2024-07-24 DIAGNOSIS — K7689 Other specified diseases of liver: Secondary | ICD-10-CM | POA: Diagnosis not present

## 2024-07-24 DIAGNOSIS — K8020 Calculus of gallbladder without cholecystitis without obstruction: Secondary | ICD-10-CM | POA: Diagnosis not present

## 2024-07-24 DIAGNOSIS — N83201 Unspecified ovarian cyst, right side: Secondary | ICD-10-CM | POA: Insufficient documentation

## 2024-07-24 MED ORDER — IOHEXOL 300 MG/ML  SOLN
100.0000 mL | Freq: Once | INTRAMUSCULAR | Status: AC | PRN
  Administered 2024-07-24: 100 mL via INTRAVENOUS

## 2024-08-21 ENCOUNTER — Encounter: Payer: Self-pay | Admitting: Family Medicine

## 2024-08-21 ENCOUNTER — Ambulatory Visit: Admitting: Family Medicine

## 2024-08-21 ENCOUNTER — Other Ambulatory Visit (HOSPITAL_BASED_OUTPATIENT_CLINIC_OR_DEPARTMENT_OTHER): Payer: Self-pay

## 2024-08-21 VITALS — BP 124/88 | HR 86 | Temp 97.7°F | Ht 65.0 in | Wt 208.0 lb

## 2024-08-21 DIAGNOSIS — J069 Acute upper respiratory infection, unspecified: Secondary | ICD-10-CM

## 2024-08-21 MED ORDER — DOXYCYCLINE HYCLATE 100 MG PO TABS
100.0000 mg | ORAL_TABLET | Freq: Two times a day (BID) | ORAL | 0 refills | Status: AC
Start: 2024-08-21 — End: 2024-08-28
  Filled 2024-08-21: qty 14, 7d supply, fill #0

## 2024-08-21 NOTE — Patient Instructions (Addendum)
 Try Debrox drops to soften the ear wax. Can also try 1/2 warm water with 1/2 peroxide. If feeling like it isn't clearing, we can flush your ear out after you get the wax soft with the Debrox drops.      Over the counter medications that may be helpful for symptoms:  Guaifenesin 1200 mg extended release tabs twice daily, with plenty of water For cough and congestion Brand name: Mucinex   Pseudoephedrine 30 mg, one or two tabs every 4 to 6 hours For sinus congestion Brand name: Sudafed You must get this from the pharmacy counter.  Oxymetazoline nasal spray each morning, one spray in each nostril, for NO MORE THAN 3 days  For nasal and sinus congestion Brand name: Afrin Saline nasal spray or Saline Nasal Irrigation (Netti Pot, etc) 3-5 times a day For nasal and sinus congestion Brand names: Ocean or AYR Fluticasone nasal spray OR Mometasone nasal spray OR Triamcinolone Acetonide nasal spray - follow directions on the packaging For nasal and sinus congestion Brand name: Flonase, Nasonex, Nasacort Warm salt water gargles  For sore throat Every few hours as needed Alternate ibuprofen 400-600 mg and acetaminophen  1000 mg every 6 hours For fever, body aches, headache Brand names: Motrin or Advil and Tylenol  Dextromethorphan 12-hour cough version 30 mg every 12 hours  For cough Brand name: Delsym Stop all other cold medications for now (Nyquil, Dayquil, Tylenol  Cold, Theraflu, etc) and other non-prescription cough/cold preparations. Many of these have the same ingredients listed above and could cause an overdose of medication.   Herbal treatments that have been shown to be helpful in some patients include: Vitamin C 1000 mg per day Zinc 100 mg per day Quercetin 25-500 mg twice a day Melatonin 5-10mg  at bedtime Honey Green Tea  General Instructions Allow your body to rest Drink PLENTY of fluids Typically, we are the most contagious 1-2 days before symptoms start through the first  2-3 days of most severe symptoms. Per CDC guidelines, you can return to school/work when symptoms have started to improve and you have been fever-free for 24 hours. However, recommend you continue extra precautions for the following 5 days (frequent hand hygiene, masking, covering coughs/sneezes, minimize exposure to immunocompromised individuals, etc).  If you develop severe shortness of breath, uncontrolled fevers, coughing up blood, confusion, chest pain, or signs of dehydration (such as significantly decreased urine amounts or dizziness with standing) please go to the nearest ER.

## 2024-08-21 NOTE — Progress Notes (Signed)
 Acute Office Visit  Subjective:  Patient ID: Kimberly Orr, female    DOB: 1986/06/29  Age: 38 y.o. MRN: [id number]  CC:  Chief Complaint  Patient presents with   Cough      HPI Kimberly Orr is here for Productive Cough and Congestion.     Discussed the use of AI scribe software for clinical note transcription with the patient, who gave verbal consent to proceed.  History of Present Illness Kimberly Orr is a 38 year old female who presents with a persistent cough and congestion.  She has been experiencing a persistent, nonproductive cough and congestion for the past two weeks, with symptoms worsening in the evenings. The cough is described as horrible and is accompanied by postnasal drip. She initially tried Mucinex, which was ineffective, and Loratadine has also not provided relief.  Last week, she experienced a fever on Wednesday and Thursday, which she managed with Tylenol  and ibuprofen. The fever was accompanied by intense myalgia, chills, and fatigue, resembling flu-like symptoms. Although the fever has resolved, she continues to experience fatigue and mild dyspnea. No nausea, vomiting, diarrhea, or chest pain is present.  She reports aural fullness and popping, particularly in the right ear. There is no vertigo. Her last antibiotic use was over three months ago, and she has no known allergies to antibiotics.  She has been using Tylenol , Mucinex. No hemoptysis. Denies any potential for pregnancy.          Past Medical History:  Diagnosis Date   Anemia    Basal cell carcinoma    Depression    GERD (gastroesophageal reflux disease)    History of chicken pox    Joint pain     Past Surgical History:  Procedure Laterality Date   BREAST BIOPSY Left 01/14/2023   US  LT BREAST BX W LOC DEV 1ST LESION IMG BX SPEC US  GUIDE 01/14/2023 [facility]   BREAST BIOPSY Left 03/12/2023   US  LT RADIOACTIVE SEED LOC 03/12/2023 [facility]   DILATION AND  CURETTAGE OF UTERUS  10/22/2009   LUMBAR DISC SURGERY     RADIOACTIVE SEED GUIDED EXCISIONAL BREAST BIOPSY Left 03/13/2023   Procedure: RADIOACTIVE SEED GUIDED EXCISIONAL LEFT BREAST BIOPSY;  Surgeon: Ebbie Cough, MD;  Location: [facility];  Service: General;  Laterality: Left;   WISDOM TOOTH EXTRACTION      Family History  Problem Relation Age of Onset   Cancer Mother        pre-cancerous skin   Cancer Father        precancerous skin   Hyperlipidemia Father    Parkinson's disease Maternal Grandmother    Heart attack Maternal Grandfather    Hypertension Maternal Grandfather    Heart disease Maternal Grandfather    Renal cancer Maternal Grandfather    Cancer Paternal Grandmother        breast cancer   Breast cancer Paternal Grandmother    Cancer Paternal Grandfather        prostate cancer   Skin cancer Other        mother and father    Colon cancer Neg Hx     Social History   Socioeconomic History   Marital status: Married    Spouse name: Not on file   Number of children: Not on file   Years of education: Not on file   Highest education level: Not on file  Occupational History   Occupation: Nurse  Tobacco Use   Smoking status:  Never   Smokeless tobacco: Never  Vaping Use   Vaping status: Never Used  Substance and Sexual Activity   Alcohol use: No   Drug use: No   Sexual activity: Yes    Partners: Male    Birth control/protection: Surgical    Comment: Husband had vasectomy  Other Topics Concern   Not on file  Social History Narrative   Not on file   Social Drivers of Health   Financial Resource Strain: Not on file  Food Insecurity: Not on file  Transportation Needs: Not on file  Physical Activity: Not on file  Stress: Not on file  Social Connections: Not on file  Intimate Partner Violence: Not on file    ROS All ROS negative except what is listed in the HPI.   Objective:   Today's Vitals: BP 124/88   Pulse 86   Temp 97.7 F  (36.5 C) (Oral)   Ht 5' 5 (1.651 m)   Wt 208 lb (94.3 kg)   LMP 07/10/2024   SpO2 99%   BMI 34.61 kg/m   Physical Exam Vitals reviewed.  Constitutional:      Appearance: Normal appearance.  HENT:     Right Ear: There is impacted cerumen.     Left Ear: Tympanic membrane normal.  Cardiovascular:     Rate and Rhythm: Normal rate and regular rhythm.     Heart sounds: Normal heart sounds.  Pulmonary:     Effort: Pulmonary effort is normal.     Breath sounds: Normal breath sounds. No wheezing, rhonchi or rales.     Comments: Productive cough Skin:    General: Skin is warm and dry.  Neurological:     Mental Status: She is alert and oriented to person, place, and time.  Psychiatric:        Mood and Affect: Mood normal.        Behavior: Behavior normal.        Thought Content: Thought content normal.        Judgment: Judgment normal.          Assessment & Plan:   Problem List Items Addressed This Visit   None Visit Diagnoses       Upper respiratory tract infection, unspecified type    -  Primary   Relevant Medications   doxycycline (VIBRA-TABS) 100 MG tablet        Assessment & Plan Acute upper respiratory infection Symptoms suggest post-viral cough with possible bacterial involvement due to persistent congestion. - Prescribed doxycycline. Advised taking with food and water. - Recommended Mucinex 1200 mg twice daily. Try Delsym. - Encouraged increased fluid intake, honey, and lemon. - Advised sleeping in a reclined position. - Discussed doxycycline side effects and probiotic use. - Instructed to separate doxycycline from multivitamins by two hours.  Impacted cerumen, right ear Right ear completely blocked with hard cerumen - Recommended Debrox drops to soften cerumen. - Advised using warm water and peroxide mixture post-softening. - Consider ear flushing if cerumen persists.        Follow-up: Return if symptoms worsen or fail to improve.   Waddell FURY Almarie, DNP, FNP-C  I,Emily Lagle,acting as a neurosurgeon for Waddell KATHEE Almarie, NP.,have documented all relevant documentation on the behalf of Waddell KATHEE Almarie, NP,as directed by  Waddell KATHEE Almarie, NP while in the presence of Waddell KATHEE Almarie, NP.   I, Waddell KATHEE Almarie, NP, have reviewed all documentation for this visit. The documentation on 08/21/2024 for the exam,  diagnosis, procedures, and orders are all accurate and complete.

## 2024-09-11 ENCOUNTER — Ambulatory Visit (INDEPENDENT_AMBULATORY_CARE_PROVIDER_SITE_OTHER)

## 2024-09-11 DIAGNOSIS — Z23 Encounter for immunization: Secondary | ICD-10-CM | POA: Diagnosis not present

## 2024-09-11 NOTE — Progress Notes (Signed)
 Patient her for flu vaccine.  Tolerated well.  Erminio DELENA Rumps, RN'
